# Patient Record
Sex: Male | Born: 1990 | Hispanic: Yes | Marital: Single | Smoking: Current some day smoker
Health system: Southern US, Community
[De-identification: ages and names within clinical notes are randomized; demographics above are authoritative.]

---

## 2015-12-07 ENCOUNTER — Emergency Department (HOSPITAL_COMMUNITY)
Admission: EM | Admit: 2015-12-07 | Discharge: 2015-12-08 | Disposition: A | Discharge status: Home / Self Care | Payer: Self-pay | Attending: Emergency Medicine | Admitting: Emergency Medicine

## 2015-12-07 DIAGNOSIS — Y939 Activity, unspecified: Secondary | ICD-10-CM | POA: Insufficient documentation

## 2015-12-07 DIAGNOSIS — Y999 Unspecified external cause status: Secondary | ICD-10-CM | POA: Insufficient documentation

## 2015-12-07 DIAGNOSIS — S0003XA Contusion of scalp, initial encounter: Secondary | ICD-10-CM | POA: Insufficient documentation

## 2015-12-07 DIAGNOSIS — S20221A Contusion of right back wall of thorax, initial encounter: Secondary | ICD-10-CM

## 2015-12-07 DIAGNOSIS — R079 Chest pain, unspecified: Secondary | ICD-10-CM | POA: Insufficient documentation

## 2015-12-07 DIAGNOSIS — Y929 Unspecified place or not applicable: Secondary | ICD-10-CM | POA: Insufficient documentation

## 2015-12-07 DIAGNOSIS — S20229A Contusion of unspecified back wall of thorax, initial encounter: Secondary | ICD-10-CM | POA: Insufficient documentation

## 2015-12-07 MED ORDER — OXYCODONE-ACETAMINOPHEN 5-325 MG PO TABS
2.0000 | ORAL_TABLET | Freq: Once | ORAL | Status: AC
  Administered 2015-12-07: 2 via ORAL
  Filled 2015-12-07: qty 2

## 2015-12-07 MED ORDER — KETOROLAC TROMETHAMINE 30 MG/ML IJ SOLN
30.0000 mg | Freq: Once | INTRAMUSCULAR | Status: AC
  Administered 2015-12-07: 30 mg via INTRAVENOUS
  Filled 2015-12-07: qty 1
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
## 2015-12-08 ENCOUNTER — Encounter (HOSPITAL_COMMUNITY): Payer: Self-pay

## 2015-12-08 ENCOUNTER — Emergency Department (HOSPITAL_COMMUNITY): Payer: Self-pay

## 2015-12-08 MED ORDER — METHOCARBAMOL 500 MG PO TABS: 500.0000 mg | ORAL_TABLET | Freq: Two times a day (BID) | ORAL | 0 refills | Status: DC | PRN

## 2015-12-08 MED ORDER — IBUPROFEN 600 MG PO TABS: 600.0000 mg | ORAL_TABLET | Freq: Four times a day (QID) | ORAL | 0 refills | Status: DC | PRN

## 2015-12-08 MED ORDER — HYDROCODONE-ACETAMINOPHEN 5-325 MG PO TABS: 1.0000 | ORAL_TABLET | Freq: Four times a day (QID) | ORAL | 0 refills | Status: DC | PRN

## 2015-12-08 MED ORDER — IOPAMIDOL (ISOVUE-300) INJECTION 61%
75.0000 mL | Freq: Once | INTRAVENOUS | Status: AC | PRN
  Administered 2015-12-08: 75 mL via INTRAVENOUS

## 2015-12-08 NOTE — ED Notes (Signed)
Patient transported to CT 

## 2015-12-08 NOTE — Discharge Instructions (Signed)
°  Tome ibuprofeno para el dolor leve a moderado y robaxin, segn sea necesario, para los espasmos musculares. Usted puede tomar Norco para dolor severo, segn sea necesario. reas de hielo de dolor / lesin. Vuelva para cualquier nuevo o concerniente sntomas.  Take ibuprofen for mild to moderate pain and robaxin, as needed, for muscle spasms. You may take Norco for severe pain, as needed. Ice areas of pain/injury. Return for any new or concerning symptoms.
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
## 2018-11-09 ENCOUNTER — Other Ambulatory Visit: Payer: Self-pay

## 2018-11-09 ENCOUNTER — Emergency Department (HOSPITAL_COMMUNITY)
Admission: EM | Admit: 2018-11-09 | Discharge: 2018-11-10 | Payer: Self-pay | Attending: Emergency Medicine | Admitting: Emergency Medicine

## 2018-11-09 DIAGNOSIS — R4182 Altered mental status, unspecified: Secondary | ICD-10-CM | POA: Insufficient documentation

## 2018-11-09 DIAGNOSIS — F10920 Alcohol use, unspecified with intoxication, uncomplicated: Secondary | ICD-10-CM | POA: Insufficient documentation

## 2018-11-09 DIAGNOSIS — Y906 Blood alcohol level of 120-199 mg/100 ml: Secondary | ICD-10-CM | POA: Insufficient documentation

## 2018-11-09 DIAGNOSIS — F419 Anxiety disorder, unspecified: Secondary | ICD-10-CM | POA: Insufficient documentation

## 2018-11-09 DIAGNOSIS — F600 Paranoid personality disorder: Secondary | ICD-10-CM | POA: Insufficient documentation
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
## 2018-11-10 LAB — ETHANOL: Alcohol, Ethyl (B): 144 mg/dL — ABNORMAL HIGH (ref <10)

## 2018-11-10 LAB — COMPREHENSIVE METABOLIC PANEL
ALT: 15 U/L (ref 0–44)
AST: 21 U/L (ref 15–41)
Albumin: 3.8 g/dL (ref 3.5–5.0)
Alkaline Phosphatase: 85 U/L (ref 38–126)
Anion gap: 10 (ref 5–15)
BUN: 22 mg/dL — ABNORMAL HIGH (ref 6–20)
CO2: 20 mmol/L — ABNORMAL LOW (ref 22–32)
Calcium: 8.2 mg/dL — ABNORMAL LOW (ref 8.9–10.3)
Chloride: 109 mmol/L (ref 98–111)
Creatinine, Ser: 1.37 mg/dL — ABNORMAL HIGH (ref 0.61–1.24)
GFR calc Af Amer: >60 mL/min (ref >60)
GFR calc non Af Amer: >60 mL/min (ref >60)
Glucose, Bld: 97 mg/dL (ref 70–99)
Potassium: 3.7 mmol/L (ref 3.5–5.1)
Sodium: 139 mmol/L (ref 135–145)
Total Bilirubin: 0.4 mg/dL (ref 0.3–1.2)
Total Protein: 6.5 g/dL (ref 6.5–8.1)

## 2018-11-10 LAB — CBC WITH DIFFERENTIAL/PLATELET
Abs Immature Granulocytes: 0.02 10*3/uL (ref 0.00–0.07)
Basophils Absolute: 0 10*3/uL (ref 0.0–0.1)
Basophils Relative: 1 %
Eosinophils Absolute: 0.1 10*3/uL (ref 0.0–0.5)
Eosinophils Relative: 3 %
HCT: 39 % (ref 39.0–52.0)
Hemoglobin: 13.2 g/dL (ref 13.0–17.0)
Immature Granulocytes: 1 %
Lymphocytes Relative: 25 %
Lymphs Abs: 1 10*3/uL (ref 0.7–4.0)
MCH: 30.8 pg (ref 26.0–34.0)
MCHC: 33.8 g/dL (ref 30.0–36.0)
MCV: 90.9 fL (ref 80.0–100.0)
Monocytes Absolute: 0.3 10*3/uL (ref 0.1–1.0)
Monocytes Relative: 7 %
Neutro Abs: 2.7 10*3/uL (ref 1.7–7.7)
Neutrophils Relative %: 63 %
Platelets: 220 10*3/uL (ref 150–400)
RBC: 4.29 MIL/uL (ref 4.22–5.81)
RDW: 13.2 % (ref 11.5–15.5)
WBC: 4.2 10*3/uL (ref 4.0–10.5)
nRBC: 0 % (ref 0.0–0.2)

## 2018-11-10 LAB — SALICYLATE LEVEL: Salicylate Lvl: <7 mg/dL (ref 2.8–30.0)

## 2018-11-10 LAB — RAPID URINE DRUG SCREEN, HOSP PERFORMED
Amphetamines: NOT DETECTED
Barbiturates: NOT DETECTED
Benzodiazepines: NOT DETECTED
Cocaine: NOT DETECTED
Opiates: NOT DETECTED
Tetrahydrocannabinol: NOT DETECTED

## 2018-11-10 LAB — ACETAMINOPHEN LEVEL: Acetaminophen (Tylenol), Serum: <10 ug/mL — ABNORMAL LOW (ref 10–30)

## 2018-11-10 MED ORDER — DIPHENHYDRAMINE HCL 50 MG/ML IJ SOLN
INTRAMUSCULAR | Status: AC
  Administered 2018-11-10: 50 mg
  Filled 2018-11-10: qty 1

## 2018-11-10 MED ORDER — LORAZEPAM 2 MG/ML IJ SOLN
INTRAMUSCULAR | Status: AC
  Administered 2018-11-10: 2 mg
  Filled 2018-11-10: qty 1

## 2018-11-10 NOTE — ED Notes (Signed)
[employer] called to inform [facility] of pt discharge as requested.

## 2018-11-10 NOTE — ED Provider Notes (Signed)
[facility]-EMERGENCY DEPT Provider Note  CSN: [id number] Arrival date & time: 11/09/18 2317  Chief Complaint(s) Alcohol Intoxication and drug intoxication  HPI Kevin Brown is a 28 y.o. male here for AMS in the setting of likely EtOH or durg use. Here with [employer] after being arrested for attacking his roommate. While being processed, patient was noted to be licking the floor and toilet. He states that "death" is coming to get him.   Remainder of history, ROS, and physical exam limited due to patient's condition (AMS). Additional information was obtained from [employer].   Level V Caveat.    HPI  Past Medical History No past medical history on file. There are no active problems to display for this patient.  Home Medication(s) Prior to Admission medications   Medication Sig Start Date End Date Taking? Authorizing Provider  HYDROcodone-acetaminophen (NORCO/VICODIN) 5-325 MG tablet Take 1-2 tablets by mouth every 6 (six) hours as needed. 12/08/15   Antony MaduraHumes, Kelly, PA-C  ibuprofen (ADVIL,MOTRIN) 600 MG tablet Take 1 tablet (600 mg total) by mouth every 6 (six) hours as needed. 12/08/15   Antony MaduraHumes, Kelly, PA-C  methocarbamol (ROBAXIN) 500 MG tablet Take 1 tablet (500 mg total) by mouth 2 (two) times daily as needed for muscle spasms. 12/08/15   Antony MaduraHumes, Kelly, PA-C                                                                                                                                    Past Surgical History ** The histories are not reviewed yet. Please review them in the "History" navigator section and refresh this SmartLink. Family History No family history on file.  Social History Social History   Tobacco Use  . Smoking status: Not on file  Substance Use Topics  . Alcohol use: Not on file  . Drug use: Not on file   Allergies Patient has no known allergies.  Review of Systems Review of Systems  Unable to perform ROS: Mental status change    Physical Exam  Vital Signs  I have reviewed the triage vital signs BP 130/81 (BP Location: Right Arm)   Pulse 88   Temp (!) 97 F (36.1 C) (Axillary)   Resp (!) 28   SpO2 95%   Physical Exam Vitals signs reviewed.  Constitutional:      General: He is not in acute distress.    Appearance: He is well-developed. He is not diaphoretic.     Comments: Intoxicated. Pointing to the ceiling, stating "death" is coming for him.  HENT:     Head: Normocephalic and atraumatic.     Nose: Nose normal.  Eyes:     General: No scleral icterus.       Right eye: No discharge.        Left eye: No discharge.     Conjunctiva/sclera: Conjunctivae normal.     Pupils: Pupils are equal, round, and reactive to  light.  Neck:     Musculoskeletal: Normal range of motion and neck supple.  Cardiovascular:     Rate and Rhythm: Normal rate and regular rhythm.     Heart sounds: No murmur. No friction rub. No gallop.   Pulmonary:     Effort: Pulmonary effort is normal. No respiratory distress.     Breath sounds: Normal breath sounds. No stridor. No rales.  Abdominal:     General: There is no distension.     Palpations: Abdomen is soft.     Tenderness: There is no abdominal tenderness.  Musculoskeletal:        General: No tenderness.  Skin:    General: Skin is warm and dry.     Findings: No erythema or rash.  Neurological:     Mental Status: He is alert and oriented to person, place, and time.  Psychiatric:        Mood and Affect: Mood is anxious.        Behavior: Behavior is agitated.        Thought Content: Thought content is paranoid.     ED Results and Treatments Labs (all labs ordered are listed, but only abnormal results are displayed) Labs Reviewed  COMPREHENSIVE METABOLIC PANEL - Abnormal; Notable for the following components:      Result Value   CO2 20 (*)    BUN 22 (*)    Creatinine, Ser 1.37 (*)    Calcium 8.2 (*)    All other components within normal limits  ETHANOL - Abnormal; Notable for the  following components:   Alcohol, Ethyl (B) 144 (*)    All other components within normal limits  ACETAMINOPHEN LEVEL - Abnormal; Notable for the following components:   Acetaminophen (Tylenol), Serum <10 (*)    All other components within normal limits  RAPID URINE DRUG SCREEN, HOSP PERFORMED  CBC WITH DIFFERENTIAL/PLATELET  SALICYLATE LEVEL                                                                                                                         EKG  EKG Interpretation  Date/Time:  Thursday November 09 2018 23:55:58 EDT Ventricular Rate:  118 PR Interval:    QRS Duration: 137 QT Interval:  375 QTC Calculation: 457 R Axis:   61 Text Interpretation:  Sinus tachycardia Paired ventricular premature complexes Consider right atrial enlargement IVCD, consider atypical RBBB ST elevation suggests acute pericarditis Artifact in lead(s) I II III aVR aVL aVF V1 V2 V3 V4 V5 V6 Poor data quality No old tracing to compare Confirmed by Addison Lank [phone]) on 11/10/2018 1:45:35 AM      Radiology No results found.  Pertinent labs & imaging results that were available during my care of the patient were reviewed by me and considered in my medical decision making (see chart for details).  Medications Ordered in ED Medications  LORazepam (ATIVAN) 2 MG/ML injection (2 mg  Given 11/10/18 0007)  diphenhydrAMINE (BENADRYL) 50 MG/ML  injection (50 mg  Given 11/10/18 0007)                                                                                                                                    Procedures .Critical Care Performed by: Nira Connardama, Pedro Eduardo, MD Authorized by: Nira Connardama, Pedro Eduardo, MD     CRITICAL CARE Performed by: Amadeo GarnetPedro Eduardo Cardama Total critical care time: 40 minutes Critical care time was exclusive of separately billable procedures and treating other patients. Critical care was necessary to treat or prevent imminent or life-threatening deterioration.  Critical care was time spent personally by me on the following activities: development of treatment plan with patient and/or surrogate as well as nursing, discussions with consultants, evaluation of patient's response to treatment, examination of patient, obtaining history from patient or surrogate, ordering and performing treatments and interventions, ordering and review of laboratory studies, ordering and review of radiographic studies, pulse oximetry and re-evaluation of patient's condition.   (including critical care time)  Medical Decision Making / ED Course I have reviewed the nursing notes for this encounter and the patient's prior records (if available in EHR or on provided paperwork).   Kevin Brown was evaluated in Emergency Department on 11/10/2018 for the symptoms described in the history of present illness. He was evaluated in the context of the global COVID-19 pandemic, which necessitated consideration that the patient might be at risk for infection with the SARS-CoV-2 virus that causes COVID-19. Institutional protocols and algorithms that pertain to the evaluation of patients at risk for COVID-19 are in a state of rapid change based on information released by regulatory bodies including the CDC and federal and state organizations. These policies and algorithms were followed during the patient's care in the ED.  Alcohol intoxication and agitation with bizarre behavior. Required chemical sedation for agitiation. Labs reassuring. Allowed to MTF.    6:04 AM Easily arousable. No longer agitated or altered. Ambulated w/o difficulty.   The patient appears reasonably screened and/or stabilized for discharge and I doubt any other medical condition or other [facility] requiring further screening, evaluation, or treatment in the ED at this time prior to discharge.  The patient is safe for discharge with strict return precautions.       Final Clinical Impression(s) / ED Diagnoses Final  diagnoses:  Alcoholic intoxication without complication (HCC)     The patient appears reasonably screened and/or stabilized for discharge and I doubt any other medical condition or other [facility] requiring further screening, evaluation, or treatment in the ED at this time prior to discharge.  Disposition: Discharge to [employer]  Condition: Good  I have discussed the results, Dx and Tx plan with the patient who expressed understanding and agree(s) with the plan. Discharge instructions discussed at great length. The patient was given strict return precautions who verbalized understanding of the instructions. No further questions at time of discharge.    ED Discharge Orders  None       Follow Up: Primary care provider  Schedule an appointment as soon as possible for a visit       This chart was dictated using voice recognition software.  Despite best efforts to proofread,  errors can occur which can change the documentation meaning.   Nira Connardama, Pedro Eduardo, MD 11/10/18 [phone]

## 2019-05-03 ENCOUNTER — Inpatient Hospital Stay (HOSPITAL_COMMUNITY)
Admission: RE | Admit: 2019-05-03 | Discharge: 2019-05-07 | DRG: 897 | Disposition: A | Discharge status: Home / Self Care | Payer: Federal, State, Local not specified - Other | Attending: Psychiatry | Admitting: Psychiatry

## 2019-05-03 ENCOUNTER — Other Ambulatory Visit: Payer: Self-pay

## 2019-05-03 ENCOUNTER — Encounter (HOSPITAL_COMMUNITY): Payer: Self-pay | Admitting: Psychiatry

## 2019-05-03 DIAGNOSIS — G4700 Insomnia, unspecified: Secondary | ICD-10-CM | POA: Diagnosis present

## 2019-05-03 DIAGNOSIS — F29 Unspecified psychosis not due to a substance or known physiological condition: Secondary | ICD-10-CM | POA: Diagnosis present

## 2019-05-03 DIAGNOSIS — Z20822 Contact with and (suspected) exposure to covid-19: Secondary | ICD-10-CM | POA: Diagnosis present

## 2019-05-03 DIAGNOSIS — F12159 Cannabis abuse with psychotic disorder, unspecified: Principal | ICD-10-CM | POA: Diagnosis present

## 2019-05-03 DIAGNOSIS — F17210 Nicotine dependence, cigarettes, uncomplicated: Secondary | ICD-10-CM | POA: Diagnosis present

## 2019-05-03 DIAGNOSIS — F19959 Other psychoactive substance use, unspecified with psychoactive substance-induced psychotic disorder, unspecified: Secondary | ICD-10-CM

## 2019-05-03 DIAGNOSIS — F329 Major depressive disorder, single episode, unspecified: Secondary | ICD-10-CM | POA: Diagnosis present

## 2019-05-03 LAB — RESPIRATORY PANEL BY RT PCR (FLU A&B, COVID)
Influenza A by PCR: NEGATIVE
Influenza B by PCR: NEGATIVE
SARS Coronavirus 2 by RT PCR: NEGATIVE

## 2019-05-03 MED ORDER — LORAZEPAM 1 MG PO TABS
2.0000 mg | ORAL_TABLET | Freq: Once | ORAL | Status: AC
  Administered 2019-05-03: 19:00:00 2 mg via ORAL

## 2019-05-03 MED ORDER — LORAZEPAM 1 MG PO TABS
2.0000 mg | ORAL_TABLET | Freq: Three times a day (TID) | ORAL | Status: DC
  Administered 2019-05-03: 2 mg via ORAL
  Filled 2019-05-03: qty 4

## 2019-05-03 MED ORDER — LORAZEPAM 1 MG PO TABS
2.0000 mg | ORAL_TABLET | Freq: Three times a day (TID) | ORAL | Status: DC
  Filled 2019-05-03: qty 4

## 2019-05-03 MED ORDER — DIPHENHYDRAMINE HCL 50 MG/ML IJ SOLN
50.0000 mg | Freq: Once | INTRAMUSCULAR | Status: AC
  Administered 2019-05-03: 50 mg via INTRAMUSCULAR
  Filled 2019-05-03: qty 1

## 2019-05-03 MED ORDER — HALOPERIDOL LACTATE 5 MG/ML IJ SOLN
5.0000 mg | Freq: Once | INTRAMUSCULAR | Status: AC
  Administered 2019-05-03: 21:00:00 5 mg via INTRAMUSCULAR
  Filled 2019-05-03: qty 1

## 2019-05-03 NOTE — BH Assessment (Signed)
Assessment Note  Kevin Brown is an 29 y.o. male. Patient presents to [facility] as a walk-in. He was escorted by [employer] and the [employer]. [employer] received a call to patient's place of residence [facility]), where he resides with another gentlemen. He was reportedly in the lobby area displaying bizarre behaviors. Those behaviors consisted of wrapping bibles in paper and stacking them in the lobby area. He reportedly told individuals that they were going to hell and he had plans to take everyone with him. He also reported that he needs to save the people in hell. Patient also verbalized suicidal ideations with an unknown plan. Patient was brought to [facility] as a walk-in to be assessed.   Upon arrival to [facility], patient was brought back to a room for his assessment. He is Spanish speaking and a interpreter was utilized for effective communication. Patient was observantly anxious, fidgety, and appeared to be experiencing acute psychosis. He was unable to provide details on his reason from coming [facility] States that he brought himself but he didn't know why. When asked if he was experiencing SI. He stated, "No, I'm going to hell". He did not confirm or deny event when asked a second time. He denies prior history of suicide attempts/gestures. Denies self mutilating behaviors. Denied HI. Denies history of violence and/or aggressive behaviors. Denies legal issues. He was asked about AVH's. He did not confirm/deny. However, repeated several times, "It doesn't matter because everyone is going to hell". Patient denies a history of mental health treatment and substance use. He denies prior history of mental inpatient treatment. Also, denies history of outpatient treatment.   Collateral information obtained from his supervisor(s) "Erinque" [phone] and "Sharyn Lull" [phone].    Diagnosis:   Past Medical History: No past medical history on file.  Family History: No family history on file.  Social  History:  has no history on file for tobacco, alcohol, and drug.  Additional Social History:  Alcohol / Drug Use Pain Medications: SEE MAR Prescriptions: SEE MAR Over the Counter: SEE MAR History of alcohol / drug use?: (Patient denies substance use. However, his boss reports that he has a 4-5 substance use history.) Longest period of sobriety (when/how long): unk Substance #1 Name of Substance 1: Alcohol, per note on  triage papers. Patient denies. 1 - Age of First Use: unk 1 - Amount (size/oz): unk 1 - Frequency: unk 1 - Duration: unk Substance #2 Name of Substance 2: Cocaine, per collateral information from patient's boss "Rosamaria Lints". Patient denies. 2 - Age of First Use: unk 2 - Amount (size/oz): unk 2 - Frequency: unk 2 - Duration: unk 2 - Last Use / Amount: unk Substance #3 Name of Substance 3: Cocaine, per collateral information from patient's boss "Rosamaria Lints". Patient denies. 3 - Age of First Use: unk 3 - Amount (size/oz): unk 3 - Frequency: unk 3 - Duration: unk 3 - Last Use / Amount: unk  CIWA: CIWA-Ar BP: (!) 121/96(Nurse was notified Jan RN) Pulse Rate: (!) 102(Nurse was notified Jan RN) COWS:    Allergies: No Known Allergies  Home Medications: (Not in a hospital admission)   OB/GYN Status:  No LMP for male patient.  General Assessment Data Location of Assessment: [facility] Assessment Services TTS Assessment: In system Is this a Tele or Face-to-Face Assessment?: Face-to-Face Is this an Initial Assessment or a Re-assessment for this encounter?: Initial Assessment Patient Accompanied by:: ([employer] and Behavioral Health Response Team) Language Other than English: Yes What is your preferred language: Spanish  Living Arrangements: Other (Comment)(lives in a hotel with another gentleman) What gender do you identify as?: Male Marital status: Single Pregnancy Status: No Living Arrangements: Other (Comment)(lives with a gentleman in a hotel room) Can pt return to current  living arrangement?: Yes Admission Status: Voluntary Is patient capable of signing voluntary admission?: Yes Referral Source: Self/Family/Friend Insurance type: (Self Pay)  Medical Screening Exam [facility] Walk-in ONLY) Medical Exam completed: Yes  Crisis Care Plan Living Arrangements: Other (Comment)(lives with a gentleman in a hotel room) Legal Guardian: (no legal guardian ) Name of Psychiatrist: (no psychiatrist ) Name of Therapist: (no therapist )  Education Status Is patient currently in school?: No Is the patient employed, 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 : No Suicidal Intent: No-Not Currently/Within Last 6 Months(patient denies; reports from Response Team indicate suicidal) Has patient had any suicidal intent within the past 6 months prior to admission? : No Is patient at risk for suicide?: No Suicidal Plan?: No Has patient had any suicidal plan within the past 6 months prior to admission? : No Access to Means: No What has been your use of drugs/alcohol within the last 12 months?: (patient denies; per collateral he uses cocaine and thc) Previous Attempts/Gestures: No How many times?: (0) Other Self Harm Risks: (no self harm reported) Triggers for Past Attempts: Other (Comment)(no previous attempts/gestures ) Intentional Self Injurious Behavior: None Family Suicide History: No Recent stressful life event(s): Other (Comment)(patient denies ) Persecutory voices/beliefs?: No Depression: Yes Depression Symptoms: Feeling angry/irritable, Feeling worthless/self pity, Loss of interest in usual pleasures, Fatigue, Isolating Substance abuse history and/or treatment for substance abuse?: No Suicide prevention information given to non-admitted  patients: Not applicable  Risk to Others within the past 6 months Homicidal Ideation: No Does patient have any lifetime risk of violence toward others beyond the six months prior to admission? : No Thoughts of Harm to Others: No Current Homicidal Intent: No Current Homicidal Plan: No Access to Homicidal Means: No Identified Victim: (n/a) History of harm to others?: No Assessment of Violence: None Noted Violent Behavior Description: (Patient is anxious and fidgety during the assessment) Does patient have access to weapons?: No Criminal Charges Pending?: No Does patient have a court date: No Is patient on probation?: No  Psychosis Hallucinations: None noted(pt denies but appears to be experiencing acute psychosis) Delusions: (patient denies but appears to be experiencing acute psychosi)  Mental Status Report Appearance/Hygiene: Disheveled Eye Contact: Good Motor Activity: Freedom of movement Speech: Logical/coherent Level of Consciousness: Alert Mood: Depressed Affect: Appropriate to circumstance Anxiety Level: None Thought Processes: Coherent, Relevant Judgement: Impaired Orientation: Person, Place, Time, Situation Obsessive Compulsive Thoughts/Behaviors: None  Cognitive Functioning Concentration: Decreased Memory: Recent Intact, Remote Intact Is patient IDD: No Insight: Poor Impulse Control: Fair Appetite: Fair Have you had any weight changes? : No Change Amount of the weight change? (lbs): (none reported) Sleep: No Change Total Hours of Sleep: (no hrs reported) Vegetative Symptoms: None  ADLScreening [facility] Assessment Services) Patient's cognitive ability adequate to safely complete daily activities?: Yes Patient able to express need for assistance with ADLs?: Yes Independently performs ADLs?: Yes (appropriate for developmental age)  Prior Inpatient Therapy Prior Inpatient Therapy: No  Prior Outpatient Therapy Prior Outpatient Therapy: No Does patient have an  ACCT team?: No Does patient have Intensive  In-House Services?  : No Does patient have [facility] services? : No Does patient have P4CC services?: No  ADL Screening (condition at time of admission) Patient's cognitive ability adequate to safely complete daily activities?: Yes Is the patient deaf or have difficulty hearing?: No Does the patient have difficulty seeing, even when wearing glasses/contacts?: No Does the patient have difficulty concentrating, remembering, or making decisions?: No Patient able to express need for assistance with ADLs?: Yes Does the patient have difficulty dressing or bathing?: No Independently performs ADLs?: Yes (appropriate for developmental age) Does the patient have difficulty walking or climbing stairs?: No Weakness of Legs: None Weakness of Arms/Hands: None  Home Assistive Devices/Equipment Home Assistive Devices/Equipment: None    Abuse/Neglect Assessment (Assessment to be complete while patient is alone) Physical Abuse: Denies Verbal Abuse: Denies Sexual Abuse: Denies Exploitation of patient/patient's resources: Denies Self-Neglect: Denies Values / Beliefs Cultural Requests During Hospitalization: None Spiritual Requests During Hospitalization: None Consults Spiritual Care Consult Needed: No Transition of Care Team Consult Needed: No Advance Directives (For Healthcare) Does Patient Have a Medical Advance Directive?: No Nutrition Screen- [facility] Adult/WL/AP Patient's home diet: Regular Has the patient recently lost weight without trying?: No Has the patient been eating poorly because of a decreased appetite?: No Malnutrition Screening Tool Score: 0        Disposition: Patient meets criteria for the Observation Unit. Admit(Admit to the observeration unit per [place], FNP) Disposition Initial Assessment Completed for this Encounter: Yes Disposition of Patient: Admit(Admit to the observeration unit per Dareen Piano, FNP)  On Site Evaluation by:    Reviewed with Physician:    Melynda Ripple 05/03/2019 2:31 PM

## 2019-05-03 NOTE — Progress Notes (Addendum)
Pt agitated, pacing the hallway. Unredirectable to room. NP Rashaun Dixon notified.  Pt walking in other patients rooms.

## 2019-05-03 NOTE — Progress Notes (Signed)
Patient ID: Kevin Brown, male   DOB: 07/03/1990, 29 y.o.   MRN: [id number] Pt awake, alert & responsive, no distress noted, redirectable.  Pt speaks Spanish.  Monitoring for safety.

## 2019-05-03 NOTE — Progress Notes (Signed)
Pt has been agitated pacing the hall, talking to himself, reaching in the air and wanting to leave. Attempted to complete admission with interpretor and pt was confused and agitated. AC alerted and orders obtained for medication. Safety maintained on the unit.

## 2019-05-03 NOTE — Plan of Care (Signed)
Patient seen with a translator/interpreter Patient had approached the hotel staff at the [facility] in explaining that he was a prophet to take people to hell, and had a Bible wrapped in a towel, police brought him here, the patient denies all symptoms however and continually repeats questions discharge however he did express some degree of paranoia and it is vague.  He also stated he was drinking something and "took something"  I explained to the patient this seems to fit the pattern of a drug-induced psychosis and he does have a history of substance abuse according the chart therefore were going to put him in observation and monitor/treat conservatively

## 2019-05-04 ENCOUNTER — Encounter (HOSPITAL_COMMUNITY): Payer: Self-pay | Admitting: Psychiatry

## 2019-05-04 DIAGNOSIS — F329 Major depressive disorder, single episode, unspecified: Secondary | ICD-10-CM | POA: Diagnosis present

## 2019-05-04 DIAGNOSIS — F12159 Cannabis abuse with psychotic disorder, unspecified: Secondary | ICD-10-CM | POA: Diagnosis present

## 2019-05-04 DIAGNOSIS — G4700 Insomnia, unspecified: Secondary | ICD-10-CM | POA: Diagnosis present

## 2019-05-04 DIAGNOSIS — Z20822 Contact with and (suspected) exposure to covid-19: Secondary | ICD-10-CM | POA: Diagnosis present

## 2019-05-04 DIAGNOSIS — F17210 Nicotine dependence, cigarettes, uncomplicated: Secondary | ICD-10-CM | POA: Diagnosis present

## 2019-05-04 DIAGNOSIS — F19959 Other psychoactive substance use, unspecified with psychoactive substance-induced psychotic disorder, unspecified: Secondary | ICD-10-CM | POA: Diagnosis not present

## 2019-05-04 DIAGNOSIS — F29 Unspecified psychosis not due to a substance or known physiological condition: Secondary | ICD-10-CM | POA: Diagnosis present

## 2019-05-04 LAB — CBC WITH DIFFERENTIAL/PLATELET
Abs Immature Granulocytes: 0 10*3/uL (ref 0.00–0.07)
Basophils Absolute: 0 10*3/uL (ref 0.0–0.1)
Basophils Relative: 1 %
Eosinophils Absolute: 0.2 10*3/uL (ref 0.0–0.5)
Eosinophils Relative: 5 %
HCT: 45.2 % (ref 39.0–52.0)
Hemoglobin: 15.1 g/dL (ref 13.0–17.0)
Immature Granulocytes: 0 %
Lymphocytes Relative: 31 %
Lymphs Abs: 1.1 10*3/uL (ref 0.7–4.0)
MCH: 30.7 pg (ref 26.0–34.0)
MCHC: 33.4 g/dL (ref 30.0–36.0)
MCV: 91.9 fL (ref 80.0–100.0)
Monocytes Absolute: 0.4 10*3/uL (ref 0.1–1.0)
Monocytes Relative: 10 %
Neutro Abs: 2 10*3/uL (ref 1.7–7.7)
Neutrophils Relative %: 53 %
Platelets: 226 10*3/uL (ref 150–400)
RBC: 4.92 MIL/uL (ref 4.22–5.81)
RDW: 12.7 % (ref 11.5–15.5)
WBC: 3.6 10*3/uL — ABNORMAL LOW (ref 4.0–10.5)
nRBC: 0 % (ref 0.0–0.2)

## 2019-05-04 LAB — COMPREHENSIVE METABOLIC PANEL
ALT: 21 U/L (ref 0–44)
AST: 29 U/L (ref 15–41)
Albumin: 3.7 g/dL (ref 3.5–5.0)
Alkaline Phosphatase: 98 U/L (ref 38–126)
Anion gap: 9 (ref 5–15)
BUN: 18 mg/dL (ref 6–20)
CO2: 27 mmol/L (ref 22–32)
Calcium: 8.7 mg/dL — ABNORMAL LOW (ref 8.9–10.3)
Chloride: 105 mmol/L (ref 98–111)
Creatinine, Ser: 1.2 mg/dL (ref 0.61–1.24)
GFR calc Af Amer: >60 mL/min (ref >60)
GFR calc non Af Amer: >60 mL/min (ref >60)
Glucose, Bld: 135 mg/dL — ABNORMAL HIGH (ref 70–99)
Potassium: 4.4 mmol/L (ref 3.5–5.1)
Sodium: 141 mmol/L (ref 135–145)
Total Bilirubin: 0.4 mg/dL (ref 0.3–1.2)
Total Protein: 6.3 g/dL — ABNORMAL LOW (ref 6.5–8.1)

## 2019-05-04 LAB — TSH: TSH: 2.276 u[IU]/mL (ref 0.350–4.500)

## 2019-05-04 LAB — ETHANOL: Alcohol, Ethyl (B): <10 mg/dL (ref <10)

## 2019-05-04 MED ORDER — OLANZAPINE 10 MG PO TBDP: 10.0000 mg | ORAL_TABLET | Freq: Three times a day (TID) | ORAL | Status: DC | PRN

## 2019-05-04 MED ORDER — TEMAZEPAM 15 MG PO CAPS: 30.0000 mg | ORAL_CAPSULE | Freq: Every day | ORAL | Status: DC

## 2019-05-04 MED ORDER — BENZTROPINE MESYLATE 0.5 MG PO TABS
0.5000 mg | ORAL_TABLET | Freq: Two times a day (BID) | ORAL | Status: DC
  Administered 2019-05-04: 10:00:00 0.5 mg via ORAL
  Filled 2019-05-04 (×3): qty 1

## 2019-05-04 MED ORDER — ZIPRASIDONE MESYLATE 20 MG IM SOLR: 20.0000 mg | INTRAMUSCULAR | Status: DC | PRN

## 2019-05-04 MED ORDER — THIAMINE HCL 100 MG PO TABS
100.0000 mg | ORAL_TABLET | Freq: Every day | ORAL | Status: DC
  Administered 2019-05-04 – 2019-05-07 (×4): 100 mg via ORAL
  Filled 2019-05-04 (×7): qty 1

## 2019-05-04 MED ORDER — LORAZEPAM 1 MG PO TABS: 1.0000 mg | ORAL_TABLET | Freq: Four times a day (QID) | ORAL | Status: DC | PRN

## 2019-05-04 MED ORDER — MAGNESIUM HYDROXIDE 400 MG/5ML PO SUSP: 30.0000 mL | Freq: Every day | ORAL | Status: DC | PRN

## 2019-05-04 MED ORDER — LORAZEPAM 1 MG PO TABS: 1.0000 mg | ORAL_TABLET | Freq: Three times a day (TID) | ORAL | Status: DC

## 2019-05-04 MED ORDER — TEMAZEPAM 15 MG PO CAPS
15.0000 mg | ORAL_CAPSULE | Freq: Every day | ORAL | Status: DC
  Administered 2019-05-05: 21:00:00 15 mg via ORAL
  Filled 2019-05-04 (×3): qty 1

## 2019-05-04 MED ORDER — HYDROXYZINE HCL 25 MG PO TABS: 25.0000 mg | ORAL_TABLET | Freq: Three times a day (TID) | ORAL | Status: DC | PRN

## 2019-05-04 MED ORDER — THIAMINE HCL 100 MG/ML IJ SOLN: 100.0000 mg | Freq: Every day | INTRAMUSCULAR | Status: DC

## 2019-05-04 MED ORDER — ACETAMINOPHEN 325 MG PO TABS: 650.0000 mg | ORAL_TABLET | Freq: Four times a day (QID) | ORAL | Status: DC | PRN

## 2019-05-04 MED ORDER — RISPERIDONE 3 MG PO TABS
3.0000 mg | ORAL_TABLET | Freq: Two times a day (BID) | ORAL | Status: DC
  Administered 2019-05-04: 10:00:00 3 mg via ORAL
  Filled 2019-05-04: qty 1

## 2019-05-04 MED ORDER — FOLIC ACID 1 MG PO TABS
1.0000 mg | ORAL_TABLET | Freq: Every day | ORAL | Status: DC
  Administered 2019-05-04 – 2019-05-07 (×4): 1 mg via ORAL
  Filled 2019-05-04 (×7): qty 1

## 2019-05-04 MED ORDER — LORAZEPAM 1 MG PO TABS: 1.0000 mg | ORAL_TABLET | ORAL | Status: DC | PRN

## 2019-05-04 MED ORDER — OLANZAPINE 5 MG PO TBDP
5.0000 mg | ORAL_TABLET | ORAL | Status: AC
  Administered 2019-05-04: 5 mg via ORAL
  Filled 2019-05-04 (×2): qty 1

## 2019-05-04 MED ORDER — RISPERIDONE 2 MG PO TABS
2.0000 mg | ORAL_TABLET | Freq: Two times a day (BID) | ORAL | Status: DC
  Administered 2019-05-04 – 2019-05-07 (×6): 2 mg via ORAL
  Filled 2019-05-04: qty 1
  Filled 2019-05-04: qty 14
  Filled 2019-05-04 (×3): qty 1
  Filled 2019-05-04: qty 14
  Filled 2019-05-04 (×5): qty 1
  Filled 2019-05-04 (×2): qty 14

## 2019-05-04 MED ORDER — ALUM & MAG HYDROXIDE-SIMETH 200-200-20 MG/5ML PO SUSP: 30.0000 mL | ORAL | Status: DC | PRN

## 2019-05-04 NOTE — Progress Notes (Signed)
Patient is alert and ambulatory. Patient denies SI/HI. Patient did take his medications without difficulty. Patient has been out in hall and did speak with this writer to ask if he could go home. Patient provided reassurance. Q 15 minute checks in progress and patient remains safe on unit. Monitoring continues.

## 2019-05-04 NOTE — Progress Notes (Signed)
Pt is extremely paranoid. Pt slept between the beds on the floor while on 200 hall. When taken to 400, pt immediately walked to the window, peeped through the slats, and then closed the curtains. The writer left a message at [phone] and left a message requesting a Spanish speaking interpretor. Informed them to call [phone] or [phone]

## 2019-05-04 NOTE — BHH Suicide Risk Assessment (Signed)
[facility] Admission Suicide Risk Assessment   Nursing information obtained from:  Patient(with interpretor) Demographic factors:  Male Current Mental Status:  Suicidal ideation indicated by others(per admit reporrt) Loss Factors:  NA Historical Factors:  NA(pt psychotic unable to assess) Risk Reduction Factors:  Living with another person, especially a relative  Total Time spent with patient: 45 minutes Principal Problem:  Diagnosis:  Active Problems:   Substance-induced psychotic disorder (HCC)  Subjective Data: This is the first psychiatric admission according to our system for this 29 year old [place] individual, he seen with a Engineer, structural available.  The bottom line is he had abused cannabis he became acutely paranoid, police were found and he was brought in for further evaluation.  He remains paranoid and will be transferred to the [address]  Continued Clinical Symptoms:    The "Alcohol Use Disorders Identification Test", Guidelines for Use in Primary Care, Second Edition.  World Science writer [facility]). Score between 0-7:  no or low risk or alcohol related problems. Score between 8-15:  moderate risk of alcohol related problems. Score between 16-19:  high risk of alcohol related problems. Score 20 or above:  warrants further diagnostic evaluation for alcohol dependence and treatment.   CLINICAL FACTORS:   Currently Psychotic Musculoskeletal: Strength & Muscle Tone: within normal limits Gait & Station: normal Patient leans: N/A  Psychiatric Specialty Exam: Physical Exam  Review of Systems  Blood pressure 126/88, pulse 73, temperature 98.4 F (36.9 C), temperature source Oral, resp. rate 16, height 5\' 1"  (1.549 m), weight 128 kg, SpO2 100 %.Body mass index is 53.32 kg/m.  General Appearance: Casual  Eye Contact:  Minimal  Speech:  Slow  Volume:  Decreased  Mood:  Anxious  Affect:  Blunt  Thought Process:  Descriptions of Associations: Circumstantial   Orientation:  Full (Time, Place, and Person)  Thought Content:  Delusions, Paranoid Ideation and Rumination  Suicidal Thoughts:  No  Homicidal Thoughts:  No  Memory:  Immediate;   Poor Recent;   Poor Remote;   Poor  Judgement:  Impaired  Insight:  Lacking  Psychomotor Activity:  Normal  Concentration:  Concentration: Poor and Attention Span: Poor  Recall:  of Knowledge:  Fair  Language:  Content vague  Akathisia:  Negative  Handed:  Right  AIMS (if indicated):     Assets:  Leisure Time Physical Health Resilience  ADL's:  Intact  Cognition:  WNL  Sleep:       COGNITIVE FEATURES THAT CONTRIBUTE TO RISK:  Closed-mindedness and Loss of executive function    SUICIDE RISK:   Mild:  Suicidal ideation of limited frequency, intensity, duration, and specificity.  There are no identifiable plans, no associated intent, mild dysphoria and related symptoms, good self-control (both objective and subjective assessment), few other risk factors, and identifiable protective factors, including available and accessible social support.  PLAN OF CARE: Patient will be transferred to the 300 hall  I certify that inpatient services furnished can reasonably be expected to improve the patient's condition.   [employer], MD 05/04/2019, 11:37 AM

## 2019-05-04 NOTE — BHH Counselor (Addendum)
TTS received Findings and Custody from [employer] and contacted [employer] to serve IVC. Paperwork left at front desk for [employer] to serve.  [employer] arrived and signed papers. TTS offered to walk officer back to unit to serve paperwork. Officer responded "I just sign them" and walked away. Counselor walked papers to adult unit where they were placed in patient's chart.
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
## 2019-05-04 NOTE — Discharge Summary (Signed)
Patient admitted to [facility] 306-01

## 2019-05-04 NOTE — Progress Notes (Signed)
   05/04/19 2200  Psych Admission Type (Psych Patients Only)  Admission Status Voluntary  Psychosocial Assessment  Patient Complaints None  Eye Contact Intense  Facial Expression Anxious  Affect Appropriate to circumstance  Speech Pressured  Interaction Assertive  Motor Activity Slow  Appearance/Hygiene Disheveled  Behavior Characteristics Appropriate to situation  Mood Suspicious  Thought Process  Coherency Disorganized  Content Preoccupation  Delusions UTA  Perception Hallucinations  Hallucination Auditory;Visual  Judgment Impaired  Confusion Mild  Danger to Self  Current suicidal ideation? Denies  Danger to Others  Danger to Others None reported or observed
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
## 2019-05-04 NOTE — Progress Notes (Signed)
Patient did not attend AA group meeting. 

## 2019-05-04 NOTE — BH Assessment (Signed)
BHH Assessment Progress Note  Per Malvin Johns, MD, this pt requires psychiatric hospitalization at this time.  Jasmine has assigned pt to [facility] Rm 306-1.  Pt presents voluntarily, but refuses to be admitted.  Dr Jola Babinski finds that pt meets criteria for IVC, which he has initiated.  IVC documents have been faxed to [facility], and [employer] confirms receipt.  He agrees to fax Findings and Custody Order to [phone].  Celedonio Miyamoto, LCSW agrees to watch for it and to contact [employer] to complete Return of Service upon receipt.  Doylene Canning, [place] Behavioral Health Coordinator [phone]

## 2019-05-04 NOTE — Plan of Care (Signed)
Case summary  This 29 year old [place] individual was brought in by police, he was staying at a local hotel, and expressed delusional believes that he was a prophet to "take people to hell" and was behaving bizarrely.  He later acknowledged cannabis abuse and possibly abuse of other compounds but would not be more specific.  Through the night he is continued to display paranoid behaviors and thoughts, believes someone was outside his window this morning.  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

## 2019-05-04 NOTE — Tx Team (Signed)
Initial Treatment Plan 05/04/2019 2:36 PM [name] PRK:[id number]    PATIENT STRESSORS: Health problems Substance abuse   PATIENT STRENGTHS: Capable of independent living Motivation for treatment/growth Physical Health Supportive family/friends   PATIENT IDENTIFIED PROBLEMS: anxiety  paranoia  Substance use/abuse                 DISCHARGE CRITERIA:  Ability to meet basic life and health needs Adequate post-discharge living arrangements Improved stabilization in mood, thinking, and/or behavior Motivation to continue treatment in a less acute level of care  PRELIMINARY DISCHARGE PLAN: Attend 12-step recovery group Outpatient therapy Return to previous living arrangement  PATIENT/FAMILY INVOLVEMENT: This treatment plan has been presented to and reviewed with the patient, Kevin Brown.  The patient and family have been given the opportunity to ask questions and make suggestions.  Raylene Miyamoto, RN 05/04/2019, 2:36 PM

## 2019-05-05 NOTE — BHH Group Notes (Signed)
BHH Group Notes: (Clinical Social Work)   05/05/2019      Type of Therapy:  Group Therapy   Participation Level:  Did Not Attend - was invited both individually by MHT and by overhead announcement, chose not to attend.   Poppy Mcafee Grossman-Orr, LCSW 05/05/2019, 11:20 AM    

## 2019-05-05 NOTE — BHH Group Notes (Signed)
Adult Psychoeducational Group Note  Date:  05/05/2019 Time:  3:52 PM  Group Topic/Focus:  Goals Group:   The focus of this group is to help patients establish daily goals to achieve during treatment and discuss how the patient can incorporate goal setting into their daily lives to aide in recovery.  Participation Level:  Did Not Attend   Dione Housekeeper 05/05/2019, 3:52 PM

## 2019-05-05 NOTE — Progress Notes (Signed)
Psychoeducational Group Note  Date:  05/05/2019 Time:  2030 Group Topic/Focus:  wrap up group  Participation Level: Did Not Attend  Participation Quality:  Not Applicable  Affect:  Not Applicable  Cognitive:  Not Applicable  Insight:  Not Applicable  Engagement in Group: Not Applicable  Additional Comments:  Pt was notified that group was beginning but remained in bed.   Marcille Buffy 05/05/2019, 9:33 PM

## 2019-05-05 NOTE — Progress Notes (Signed)
[facility] MD Progress Note  05/05/2019 3:37 PM Kevin Brown  MRN:  [id number]  Subjective: Kevin Brown reports, "I don't know why I was here. Can I go?"  Objective: Kevin Brown is a 29 year old [place] male who speaks no English but is employed by a Technical brewer, he had presented to the front desk of a [employer] with a Bible, wrapped in a towel, elaborating he was a prophet to take people "to hell". Kevin Brown is seen, chart reviewed. The chart findings discussed with the treatment. Patient is in bed resting. He presents alert, smiling a lot. Limited English language, able to make needs known to the best of his ability. He denies any complaints today. He asked, "I don't know why I was here, can I go". He has been in his room all day. Minimal interaction with the other patients. Patient does not appear to be responding to any internal stimuli. He presents with improved affect & eye contact. He currently denies any SIHI, AVH, delusional thoughts or paranoia. He does not appear to be in any apparent distress.  Principal Problem: Substance-induced psychotic disorder (HCC)  Diagnosis: Principal Problem:   Substance-induced psychotic disorder (HCC) Active Problems:   Psychosis (HCC)  Total Time spent with patient: 25 minutes  Past Psychiatric History: See H&P  Past Medical History: History reviewed. No pertinent past medical history. History reviewed. No pertinent surgical history.  Family History: History reviewed. No pertinent family history.  Family Psychiatric  History: See H&P  Social History:  Social History   Substance and Sexual Activity  Alcohol Use Not Currently     Social History   Substance and Sexual Activity  Drug Use Yes  . Types: Methamphetamines, Marijuana    Social History   Socioeconomic History  . Marital status: Single    Spouse name: Not on file  . Number of children: Not on file  . Years of education: Not on file  . Highest education level: Not on file   Occupational History  . Not on file  Tobacco Use  . Smoking status: Current Every Day Smoker    Types: Cigarettes  . Smokeless tobacco: Never Used  Substance and Sexual Activity  . Alcohol use: Not Currently  . Drug use: Yes    Types: Methamphetamines, Marijuana  . Sexual activity: Not on file  Other Topics Concern  . Not on file  Social History Narrative  . Not on file   Social Determinants of Health   Financial Resource Strain:   . Difficulty of Paying Living Expenses: Not on file  Food Insecurity:   . Worried About Programme researcher, broadcasting/film/video in the Last Year: Not on file  . Ran Out of Food in the Last Year: Not on file  Transportation Needs:   . Lack of Transportation (Medical): Not on file  . Lack of Transportation (Non-Medical): Not on file  Physical Activity:   . Days of Exercise per Week: Not on file  . Minutes of Exercise per Session: Not on file  Stress:   . Feeling of Stress : Not on file  Social Connections:   . Frequency of Communication with Friends and Family: Not on file  . Frequency of Social Gatherings with Friends and Family: Not on file  . Attends Religious Services: Not on file  . Active Member of Clubs or Organizations: Not on file  . Attends Banker Meetings: Not on file  . Marital Status: Not on file   Additional Social History:  Pain Medications: SEE MAR Prescriptions: SEE MAR Over the Counter: SEE MAR History of alcohol / drug use?: (Patient denies substance use. However, his boss reports that he has a 4-5 substance use history.) Longest period of sobriety (when/how long): unk Name of Substance 1: Alcohol, per note on  triage papers. Patient denies. 1 - Age of First Use: unk 1 - Amount (size/oz): unk 1 - Frequency: unk 1 - Duration: unk Name of Substance 2: Cocaine, per collateral information from patient's boss "Thyra Breed". Patient denies. 2 - Age of First Use: unk 2 - Amount (size/oz): unk 2 - Frequency: unk 2 - Duration:  unk 2 - Last Use / Amount: unk Name of Substance 3: Cocaine, per collateral information from patient's boss "Thyra Breed". Patient denies. 3 - Age of First Use: unk 3 - Amount (size/oz): unk 3 - Frequency: unk 3 - Duration: unk 3 - Last Use / Amount: unk  Sleep: Good  Appetite:  Good  Current Medications: Current Facility-Administered Medications  Medication Dose Route Frequency Provider Last Rate Last Admin  . acetaminophen (TYLENOL) tablet 650 mg  650 mg Oral Q6H PRN Antonieta Pert, MD      . alum & mag hydroxide-simeth (MAALOX/MYLANTA) 200-200-20 MG/5ML suspension 30 mL  30 mL Oral Q4H PRN Antonieta Pert, MD      . folic acid (FOLVITE) tablet 1 mg  1 mg Oral Daily Antonieta Pert, MD   1 mg at 05/05/19 5681  . hydrOXYzine (ATARAX/VISTARIL) tablet 25 mg  25 mg Oral TID PRN Antonieta Pert, MD      . OLANZapine zydis (ZYPREXA) disintegrating tablet 10 mg  10 mg Oral Q8H PRN Antonieta Pert, MD       And  . LORazepam (ATIVAN) tablet 1 mg  1 mg Oral PRN Antonieta Pert, MD       And  . ziprasidone (GEODON) injection 20 mg  20 mg Intramuscular PRN Antonieta Pert, MD      . LORazepam (ATIVAN) tablet 1 mg  1 mg Oral Q6H PRN Antonieta Pert, MD      . magnesium hydroxide (MILK OF MAGNESIA) suspension 30 mL  30 mL Oral Daily PRN Antonieta Pert, MD      . risperiDONE (RISPERDAL) tablet 2 mg  2 mg Oral BID Malvin Johns, MD   2 mg at 05/05/19 0803  . temazepam (RESTORIL) capsule 15 mg  15 mg Oral QHS Malvin Johns, MD      . thiamine (B-1) injection 100 mg  100 mg Intravenous Daily Antonieta Pert, MD      . thiamine tablet 100 mg  100 mg Oral Daily Antonieta Pert, MD   100 mg at 05/05/19 2751   Lab Results:  Results for orders placed or performed during the hospital encounter of 05/03/19 (from the past 48 hour(s))  CBC with Differential/Platelet     Status: Abnormal   Collection Time: 05/04/19  5:45 PM  Result Value Ref Range   WBC 3.6 (L) 4.0 - 10.5 K/uL    RBC 4.92 4.22 - 5.81 MIL/uL   Hemoglobin 15.1 13.0 - 17.0 g/dL   HCT 70.0 17.4 - 94.4 %   MCV 91.9 80.0 - 100.0 fL   MCH 30.7 26.0 - 34.0 pg   MCHC 33.4 30.0 - 36.0 g/dL   RDW 96.7 59.1 - 63.8 %   Platelets 226 150 - 400 K/uL   nRBC 0.0 0.0 - 0.2 %   Neutrophils Relative %  53 %   Neutro Abs 2.0 1.7 - 7.7 K/uL   Lymphocytes Relative 31 %   Lymphs Abs 1.1 0.7 - 4.0 K/uL   Monocytes Relative 10 %   Monocytes Absolute 0.4 0.1 - 1.0 K/uL   Eosinophils Relative 5 %   Eosinophils Absolute 0.2 0.0 - 0.5 K/uL   Basophils Relative 1 %   Basophils Absolute 0.0 0.0 - 0.1 K/uL   Immature Granulocytes 0 %   Abs Immature Granulocytes 0.00 0.00 - 0.07 K/uL    Comment: Performed at [facility], [address] [place], [place] [address]  Comprehensive metabolic panel     Status: Abnormal   Collection Time: 05/04/19  5:45 PM  Result Value Ref Range   Sodium 141 135 - 145 mmol/L   Potassium 4.4 3.5 - 5.1 mmol/L   Chloride 105 98 - 111 mmol/L   CO2 27 22 - 32 mmol/L   Glucose, Bld 135 (H) 70 - 99 mg/dL   BUN 18 6 - 20 mg/dL   Creatinine, Ser 3.14 0.61 - 1.24 mg/dL   Calcium 8.7 (L) 8.9 - 10.3 mg/dL   Total Protein 6.3 (L) 6.5 - 8.1 g/dL   Albumin 3.7 3.5 - 5.0 g/dL   AST 29 15 - 41 U/L   ALT 21 0 - 44 U/L   Alkaline Phosphatase 98 38 - 126 U/L   Total Bilirubin 0.4 0.3 - 1.2 mg/dL   GFR calc non Af Amer >60 >60 mL/min   GFR calc Af Amer >60 >60 mL/min   Anion gap 9 5 - 15    Comment: Performed at [facility], [address] [place], [place] [address]  TSH     Status: None   Collection Time: 05/04/19  5:45 PM  Result Value Ref Range   TSH 2.276 0.350 - 4.500 uIU/mL    Comment: Performed by a 3rd Generation assay with a functional sensitivity of <=0.01 uIU/mL. Performed at [facility], [address] [place], [place] [address]   Ethanol     Status: None   Collection Time: 05/04/19  5:45 PM  Result Value Ref Range    Alcohol, Ethyl (B) <10 <10 mg/dL    Comment: (NOTE) Lowest detectable limit for serum alcohol is 10 mg/dL. For medical purposes only. Performed at [facility], [address], [place], [place] [address]    Blood Alcohol level:  Lab Results  Component Value Date   ETH <10 05/04/2019   ETH 144 (H) 11/10/2018   Metabolic Disorder Labs: No results found for: HGBA1C, MPG No results found for: PROLACTIN No results found for: CHOL, TRIG, HDL, CHOLHDL, VLDL, LDLCALC  Physical Findings: AIMS: Facial and Oral Movements Muscles of Facial Expression: None, normal Lips and Perioral Area: None, normal Jaw: None, normal Tongue: None, normal,Extremity Movements Upper (arms, wrists, hands, fingers): None, normal Lower (legs, knees, ankles, toes): None, normal, Trunk Movements Neck, shoulders, hips: None, normal, Overall Severity Severity of abnormal movements (highest score from questions above): None, normal Incapacitation due to abnormal movements: None, normal Patient's awareness of abnormal movements (rate only patient's report): No Awareness, Dental Status Current problems with teeth and/or dentures?: No Does patient usually wear dentures?: No  CIWA:  CIWA-Ar Total: 3 COWS:  COWS Total Score: 6  Musculoskeletal: Strength & Muscle Tone: within normal limits Gait & Station: normal Patient leans: N/A  Psychiatric Specialty Exam: Physical Exam  Nursing note and vitals reviewed. Constitutional: He is oriented  to person, place, and time. He appears well-developed.  Cardiovascular: Normal rate.  Respiratory: Effort normal.  Genitourinary:    Genitourinary Comments: Deferred   Musculoskeletal:        General: Normal range of motion.     Cervical back: Normal range of motion.  Neurological: He is alert and oriented to person, place, and time.  Skin: Skin is warm and dry.    Review of Systems  Constitutional: Negative for chills, diaphoresis and fever.  HENT:  Negative for congestion, rhinorrhea, sneezing and sore throat.   Respiratory: Negative for cough, shortness of breath and wheezing.   Cardiovascular: Negative for chest pain and palpitations.  Gastrointestinal: Negative for diarrhea, nausea and vomiting.  Genitourinary: Negative for difficulty urinating.  Musculoskeletal: Negative.   Allergic/Immunologic: Negative for environmental allergies and food allergies.  Neurological: Negative for dizziness, tremors, seizures and light-headedness.  Psychiatric/Behavioral: Positive for confusion (Does not know the reason for his admission), dysphoric mood (Stabilizing) and hallucinations (Stabilizing). Negative for agitation, behavioral problems, decreased concentration, self-injury, sleep disturbance and suicidal ideas. The patient is nervous/anxious ("I don't why Iwas here"). The patient is not hyperactive.     Blood pressure 110/84, pulse 78, temperature 98.4 F (36.9 C), temperature source Oral, resp. rate 16, height 5\' 1"  (1.549 m), weight 128 kg, SpO2 100 %.Body mass index is 53.32 kg/m.  General Appearance: Casual  Eye Contact: Good  Speech: Slow  Volume:  Decreased  Mood: Euthymic, bright affect.  Affect: Reactive, bright today  Thought Process:  Descriptions of Associations: Circumstantial  Orientation:  Full (Time, Place, and Person)  Thought Content: Brought in to [facility] due to Delusions, Paranoid Ideation & Rumination, today 05-05-19, patient does not appear to be responding to any internal stimuli.  Suicidal Thoughts:  No  Homicidal Thoughts:  No  Memory:  Immediate;   Poor Recent;   Poor Remote;   Poor  Judgement:  Impaired  Insight:  Lacking  Psychomotor Activity:  Normal  Concentration:  Concentration: Fair and Attention Span: Good  Recall:  [employer] of Knowledge:  Fair  Language:  Content vague  Akathisia:  Negative  Handed:  Right  AIMS (if indicated):     Assets:  Leisure Time Physical Health Resilience  ADL's:  Intact   Cognition:  WNL    Sleep:  Number of Hours: 6.75   Treatment Plan Summary: Daily contact with patient to assess and evaluate symptoms and progress in treatment and Medication management.  -Continue inpatient hospitalization.  -Will continue today 05/05/2019 plan as below except where it is noted.  Alcohol detox.              -Continue Lorazepam 1 mg po Q 6 hrs prn for CIWA > 10  -Mood control  -Continue risperdal 2 mg po bid.  -Anxiety  -Continue atarax 25 mg po q6h prn anxiety  -Insomnia  -Continue Restoril 15 mg po qhs  -Agitation             -Continue Olanzapine Zydis 10 mg po Q 8 hrs prn.               &              -Lorazepam 1 mg po prn x 1 dose.                &    -Continue geodon 20mg  IM q12h prn x 1 dose.  -Encourage participation in groups and therapeutic milieu  -Disposition planning will be  ongoing  Armandina Stammer, NP, pMHNP, FNP-BC 05/05/2019, 3:37 PM

## 2019-05-05 NOTE — Progress Notes (Signed)
Pt presents with an anxious affect. Pt denies SI/HI. Pt denies AVH. Pt denies depression and anxiety. Pt wanted to know when he would be able to get out of the hospital and expressed not having an understanding as to why he's here in the hospital. Writer explained to the pt that he's here in the hospital for observation and safety due to the bizarre behaviors he was exhibiting at the hotel. The pt verbalized understanding.    Orders reviewed. V/s assessed. Verbal support provided. 15 minute checks performed for safety. Pt encouraged to shower and offered clean scrubs.   Pt compliant with taking meds.  No side effects to the medications observed.

## 2019-05-06 NOTE — Progress Notes (Signed)
   05/06/19 1300  Psych Admission Type (Psych Patients Only)  Admission Status Voluntary  Psychosocial Assessment  Patient Complaints None  Eye Contact Brief  Facial Expression Animated  Affect Anxious  Speech Language other than English  Interaction Assertive  Motor Activity Fidgety  Appearance/Hygiene Unremarkable  Behavior Characteristics Appropriate to situation  Mood Anxious  Thought Process  Coherency WDL  Content WDL  Delusions None reported or observed  Perception WDL  Hallucination None reported or observed  Judgment Limited  Confusion None  Danger to Self  Current suicidal ideation? Denies  Danger to Others  Danger to Others None reported or observed

## 2019-05-06 NOTE — [facility] Group Notes (Signed)
[facility] LCSW Group Therapy Note  05/06/2019  10:00-11:00AM  Type of Therapy and Topic:  Group Therapy:  A Hero Worthy of Support  Participation Level:  Did Not Attend   Description of Group:  Patients in this group were introduced to the concept that additional supports including self-support are an essential part of recovery.  Matching needs with supports to help fulfill those needs was explained.  Establishing boundaries that can gradually be increased or decreased was described, with patients giving their own examples of establishing appropriate boundaries in their lives.  A song entitled "My Own Hero" was played and a group discussion ensued in which patients stated it inspired them to help themselves in order to succeed, because other people cannot achieve their goals such as sobriety or stability for them.  A song was played called "I Am Enough" which led to a discussion about being willing to believe we are worth the effort of being a self-support.   Therapeutic Goals: 1)  demonstrate the importance of being a key part of one's own support system 2)  discuss various available supports 3)  encourage patient to use music as part of their self-support and focus on goals 4)  elicit ideas from patients about supports that need to be added   Summary of Patient Progress:  The patient did not attend.  Therapeutic Modalities:   Motivational Interviewing Activity  Lynnell Chad
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
## 2019-05-06 NOTE — Progress Notes (Signed)
[facility] MD Progress Note  05/06/2019 2:10 PM Kevin Brown  MRN:  [id number]  Subjective: Kevin Brown reports, "I'm very good. I'm nervous"  Objective: Kevin Brown is a 29 year old [place] male who speaks no English but is employed by a Technical brewer, he had presented to the front desk of a [employer] with a Bible, wrapped in a towel, elaborating he was a prophet to take people "to hell". Kevin Brown is seen, chart reviewed. The chart findings discussed with the treatment team. Patient is in bed resting. He presents alert, smiling a lot. Limited English language, able to make needs known to the best of his ability. He denies any complaints today. He says today, "I'm very good. I'm nervous. He did get out of bed when asked do so to attend group sessions this morning. This he did without problem. Minimal interaction with the other patients during the group sessions due to language barrier. He presents stable, well groomed, smiling a lot. There are no agitation or irritability noted. He seem anxious to get out the hospital, that could be the reason for him being "anxious today". Patient does not appear to be responding to any internal stimuli. He presents with improved facial expression & good eye contact. He currently denies any SIHI, AVH, delusional thoughts or paranoia. He does not appear to be in any apparent distress.  Principal Problem: Substance-induced psychotic disorder (HCC)  Diagnosis: Principal Problem:   Substance-induced psychotic disorder (HCC) Active Problems:   Psychosis (HCC)  Total Time spent with patient: 25 minutes  Past Psychiatric History: See H&P  Past Medical History: History reviewed. No pertinent past medical history. History reviewed. No pertinent surgical history.  Family History: History reviewed. No pertinent family history.  Family Psychiatric  History: See H&P  Social History:  Social History   Substance and Sexual Activity  Alcohol Use Not Currently      Social History   Substance and Sexual Activity  Drug Use Yes  . Types: Methamphetamines, Marijuana    Social History   Socioeconomic History  . Marital status: Single    Spouse name: Not on file  . Number of children: Not on file  . Years of education: Not on file  . Highest education level: Not on file  Occupational History  . Not on file  Tobacco Use  . Smoking status: Current Every Day Smoker    Types: Cigarettes  . Smokeless tobacco: Never Used  Substance and Sexual Activity  . Alcohol use: Not Currently  . Drug use: Yes    Types: Methamphetamines, Marijuana  . Sexual activity: Not on file  Other Topics Concern  . Not on file  Social History Narrative  . Not on file   Social Determinants of Health   Financial Resource Strain:   . Difficulty of Paying Living Expenses: Not on file  Food Insecurity:   . Worried About Programme researcher, broadcasting/film/video in the Last Year: Not on file  . Ran Out of Food in the Last Year: Not on file  Transportation Needs:   . Lack of Transportation (Medical): Not on file  . Lack of Transportation (Non-Medical): Not on file  Physical Activity:   . Days of Exercise per Week: Not on file  . Minutes of Exercise per Session: Not on file  Stress:   . Feeling of Stress : Not on file  Social Connections:   . Frequency of Communication with Friends and Family: Not on file  . Frequency of Social Gatherings with Friends  and Family: Not on file  . Attends Religious Services: Not on file  . Active Member of Clubs or Organizations: Not on file  . Attends Banker Meetings: Not on file  . Marital Status: Not on file   Additional Social History:    Pain Medications: SEE MAR Prescriptions: SEE MAR Over the Counter: SEE MAR History of alcohol / drug use?: (Patient denies substance use. However, his boss reports that he has a 4-5 substance use history.) Longest period of sobriety (when/how long): unk Name of Substance 1: Alcohol, per note on   triage papers. Patient denies. 1 - Age of First Use: unk 1 - Amount (size/oz): unk 1 - Frequency: unk 1 - Duration: unk Name of Substance 2: Cocaine, per collateral information from patient's boss "Thyra Breed". Patient denies. 2 - Age of First Use: unk 2 - Amount (size/oz): unk 2 - Frequency: unk 2 - Duration: unk 2 - Last Use / Amount: unk Name of Substance 3: Cocaine, per collateral information from patient's boss "Thyra Breed". Patient denies. 3 - Age of First Use: unk 3 - Amount (size/oz): unk 3 - Frequency: unk 3 - Duration: unk 3 - Last Use / Amount: unk  Sleep: Good  Appetite:  Good  Current Medications: Current Facility-Administered Medications  Medication Dose Route Frequency Provider Last Rate Last Admin  . acetaminophen (TYLENOL) tablet 650 mg  650 mg Oral Q6H PRN Antonieta Pert, MD      . alum & mag hydroxide-simeth (MAALOX/MYLANTA) 200-200-20 MG/5ML suspension 30 mL  30 mL Oral Q4H PRN Antonieta Pert, MD      . folic acid (FOLVITE) tablet 1 mg  1 mg Oral Daily Antonieta Pert, MD   1 mg at 05/06/19 7035  . hydrOXYzine (ATARAX/VISTARIL) tablet 25 mg  25 mg Oral TID PRN Antonieta Pert, MD      . OLANZapine zydis (ZYPREXA) disintegrating tablet 10 mg  10 mg Oral Q8H PRN Antonieta Pert, MD       And  . LORazepam (ATIVAN) tablet 1 mg  1 mg Oral PRN Antonieta Pert, MD       And  . ziprasidone (GEODON) injection 20 mg  20 mg Intramuscular PRN Antonieta Pert, MD      . LORazepam (ATIVAN) tablet 1 mg  1 mg Oral Q6H PRN Antonieta Pert, MD      . magnesium hydroxide (MILK OF MAGNESIA) suspension 30 mL  30 mL Oral Daily PRN Antonieta Pert, MD      . risperiDONE (RISPERDAL) tablet 2 mg  2 mg Oral BID Malvin Johns, MD   2 mg at 05/06/19 0835  . temazepam (RESTORIL) capsule 15 mg  15 mg Oral QHS Malvin Johns, MD   15 mg at 05/05/19 2117  . thiamine (B-1) injection 100 mg  100 mg Intravenous Daily Antonieta Pert, MD      . thiamine tablet 100 mg   100 mg Oral Daily Antonieta Pert, MD   100 mg at 05/06/19 0093   Lab Results:  Results for orders placed or performed during the hospital encounter of 05/03/19 (from the past 48 hour(s))  CBC with Differential/Platelet     Status: Abnormal   Collection Time: 05/04/19  5:45 PM  Result Value Ref Range   WBC 3.6 (L) 4.0 - 10.5 K/uL   RBC 4.92 4.22 - 5.81 MIL/uL   Hemoglobin 15.1 13.0 - 17.0 g/dL   HCT 81.8 29.9 - 37.1 %  MCV 91.9 80.0 - 100.0 fL   MCH 30.7 26.0 - 34.0 pg   MCHC 33.4 30.0 - 36.0 g/dL   RDW 27.0 35.0 - 09.3 %   Platelets 226 150 - 400 K/uL   nRBC 0.0 0.0 - 0.2 %   Neutrophils Relative % 53 %   Neutro Abs 2.0 1.7 - 7.7 K/uL   Lymphocytes Relative 31 %   Lymphs Abs 1.1 0.7 - 4.0 K/uL   Monocytes Relative 10 %   Monocytes Absolute 0.4 0.1 - 1.0 K/uL   Eosinophils Relative 5 %   Eosinophils Absolute 0.2 0.0 - 0.5 K/uL   Basophils Relative 1 %   Basophils Absolute 0.0 0.0 - 0.1 K/uL   Immature Granulocytes 0 %   Abs Immature Granulocytes 0.00 0.00 - 0.07 K/uL    Comment: Performed at [facility], [address] [place], [place] [address]  Comprehensive metabolic panel     Status: Abnormal   Collection Time: 05/04/19  5:45 PM  Result Value Ref Range   Sodium 141 135 - 145 mmol/L   Potassium 4.4 3.5 - 5.1 mmol/L   Chloride 105 98 - 111 mmol/L   CO2 27 22 - 32 mmol/L   Glucose, Bld 135 (H) 70 - 99 mg/dL   BUN 18 6 - 20 mg/dL   Creatinine, Ser 9.37 0.61 - 1.24 mg/dL   Calcium 8.7 (L) 8.9 - 10.3 mg/dL   Total Protein 6.3 (L) 6.5 - 8.1 g/dL   Albumin 3.7 3.5 - 5.0 g/dL   AST 29 15 - 41 U/L   ALT 21 0 - 44 U/L   Alkaline Phosphatase 98 38 - 126 U/L   Total Bilirubin 0.4 0.3 - 1.2 mg/dL   GFR calc non Af Amer >60 >60 mL/min   GFR calc Af Amer >60 >60 mL/min   Anion gap 9 5 - 15    Comment: Performed at [facility], [address] [place], [place] [address]  TSH     Status: None   Collection Time: 05/04/19  5:45 PM   Result Value Ref Range   TSH 2.276 0.350 - 4.500 uIU/mL    Comment: Performed by a 3rd Generation assay with a functional sensitivity of <=0.01 uIU/mL. Performed at [facility], [address] [place], [place] [address]   Ethanol     Status: None   Collection Time: 05/04/19  5:45 PM  Result Value Ref Range   Alcohol, Ethyl (B) <10 <10 mg/dL    Comment: (NOTE) Lowest detectable limit for serum alcohol is 10 mg/dL. For medical purposes only. Performed at [facility], [address], [place], [place] [address]    Blood Alcohol level:  Lab Results  Component Value Date   ETH <10 05/04/2019   ETH 144 (H) 11/10/2018   Metabolic Disorder Labs: No results found for: HGBA1C, MPG No results found for: PROLACTIN No results found for: CHOL, TRIG, HDL, CHOLHDL, VLDL, LDLCALC  Physical Findings: AIMS: Facial and Oral Movements Muscles of Facial Expression: None, normal Lips and Perioral Area: None, normal Jaw: None, normal Tongue: None, normal,Extremity Movements Upper (arms, wrists, hands, fingers): None, normal Lower (legs, knees, ankles, toes): None, normal, Trunk Movements Neck, shoulders, hips: None, normal, Overall Severity Severity of abnormal movements (highest score from questions above): None, normal Incapacitation due to abnormal movements: None, normal Patient's awareness of abnormal movements (rate only patient's report): No Awareness, Dental Status Current problems with teeth and/or dentures?:  No Does patient usually wear dentures?: No  CIWA:  CIWA-Ar Total: 3 COWS:  COWS Total Score: 6  Musculoskeletal: Strength & Muscle Tone: within normal limits Gait & Station: normal Patient leans: N/A  Psychiatric Specialty Exam: Physical Exam  Nursing note and vitals reviewed. Constitutional: He is oriented to person, place, and time. He appears well-developed.  Cardiovascular: Normal rate.  Respiratory: Effort normal.   Genitourinary:    Genitourinary Comments: Deferred   Musculoskeletal:        General: Normal range of motion.     Cervical back: Normal range of motion.  Neurological: He is alert and oriented to person, place, and time.  Skin: Skin is warm and dry.    Review of Systems  Constitutional: Negative for chills, diaphoresis and fever.  HENT: Negative for congestion, rhinorrhea, sneezing and sore throat.   Respiratory: Negative for cough, shortness of breath and wheezing.   Cardiovascular: Negative for chest pain and palpitations.  Gastrointestinal: Negative for diarrhea, nausea and vomiting.  Genitourinary: Negative for difficulty urinating.  Musculoskeletal: Negative.   Allergic/Immunologic: Negative for environmental allergies and food allergies.  Neurological: Negative for dizziness, tremors, seizures and light-headedness.  Psychiatric/Behavioral: Positive for confusion (Does not know the reason for his admission), dysphoric mood (Stabilizing) and hallucinations (Stabilizing). Negative for agitation, behavioral problems, decreased concentration, self-injury, sleep disturbance and suicidal ideas. The patient is nervous/anxious ("I don't why Iwas here"). The patient is not hyperactive.     Blood pressure 110/84, pulse 78, temperature 98.4 F (36.9 C), temperature source Oral, resp. rate 16, height 5\' 1"  (1.549 m), weight 128 kg, SpO2 100 %.Body mass index is 53.32 kg/m.  General Appearance: Casual  Eye Contact: Good  Speech: Slow  Volume:  Decreased  Mood: Euthymic, bright affect.  Affect: Reactive, bright today  Thought Process:  Descriptions of Associations: Circumstantial  Orientation:  Full (Time, Place, and Person)  Thought Content: Brought in to [facility] due to Delusions, Paranoid Ideation & Rumination, today 05-05-19, patient does not appear to be responding to any internal stimuli.  Suicidal Thoughts:  No  Homicidal Thoughts:  No  Memory:  Immediate;   Poor Recent;   Poor Remote;    Poor  Judgement:  Impaired  Insight:  Lacking  Psychomotor Activity:  Normal  Concentration:  Concentration: Fair and Attention Span: Good  Recall:  05-07-19 of Knowledge:  Fair  Language:  Content vague  Akathisia:  Negative  Handed:  Right  AIMS (if indicated):     Assets:  Leisure Time Physical Health Resilience  ADL's:  Intact  Cognition:  WNL    Sleep:  Number of Hours: 6.75   Treatment Plan Summary: Daily contact with patient to assess and evaluate symptoms and progress in treatment and Medication management.  -Continue inpatient hospitalization.  -Will continue today 05/06/2019 plan as below except where it is noted.  Alcohol detox.              -Continue Lorazepam 1 mg po Q 6 hrs prn for CIWA > 10  -Mood control  -Continue risperdal 2 mg po bid.  -Anxiety  -Continue atarax 25 mg po q6h prn anxiety  -Insomnia  -Continue Restoril 15 mg po qhs  -Agitation             -Continue Olanzapine Zydis 10 mg po Q 8 hrs prn.               &              -  Lorazepam 1 mg po prn x 1 dose.                &    -Continue geodon 20mg  IM q12h prn x 1 dose.  -Encourage participation in groups and therapeutic milieu  -Disposition planning will be ongoing  Lindell Spar, NP, pMHNP, FNP-BC 05/06/2019, 2:10 PMPatient ID: Kevin Brown, male   DOB: June 02, 1990, 29 y.o.   MRN: [id number]

## 2019-05-06 NOTE — Progress Notes (Signed)
   05/05/19 2000  Psychosocial Assessment  Patient Complaints None  Eye Contact Brief  Facial Expression Animated  Affect Anxious  Speech Logical/coherent  Interaction Minimal;Poor (Speaks mostly Spanish)  Motor Activity Fidgety  Appearance/Hygiene Unremarkable  Behavior Characteristics Cooperative  Mood Pleasant;Depressed;Anxious  Thought Process  Coherency WDL  Content WDL  Delusions None reported or observed  Perception WDL  Hallucination None reported or observed  Judgment Limited  Confusion None  Danger to Self  Current suicidal ideation? Denies  Danger to Others  Danger to Others None reported or observed

## 2019-05-07 MED ORDER — RISPERIDONE 2 MG PO TABS: 2.0000 mg | ORAL_TABLET | Freq: Two times a day (BID) | ORAL | 0 refills | Status: DC

## 2019-05-07 MED ORDER — TEMAZEPAM 15 MG PO CAPS: 15.0000 mg | ORAL_CAPSULE | Freq: Every day | ORAL | 0 refills | Status: DC

## 2019-05-07 NOTE — Tx Team (Signed)
Interdisciplinary Treatment and Diagnostic Plan Update  05/07/2019 Time of Session: 10:00am Kevin Brown MRN: [id number]  Principal Diagnosis: Substance-induced psychotic disorder [facility])  Secondary Diagnoses: Principal Problem:   Substance-induced psychotic disorder (HCC) Active Problems:   Psychosis (HCC)   Current Medications:  Current Facility-Administered Medications  Medication Dose Route Frequency Provider Last Rate Last Admin  . acetaminophen (TYLENOL) tablet 650 mg  650 mg Oral Q6H PRN Antonieta Pert, MD      . alum & mag hydroxide-simeth (MAALOX/MYLANTA) 200-200-20 MG/5ML suspension 30 mL  30 mL Oral Q4H PRN Antonieta Pert, MD      . folic acid (FOLVITE) tablet 1 mg  1 mg Oral Daily Antonieta Pert, MD   1 mg at 05/07/19 0825  . hydrOXYzine (ATARAX/VISTARIL) tablet 25 mg  25 mg Oral TID PRN Antonieta Pert, MD      . OLANZapine zydis (ZYPREXA) disintegrating tablet 10 mg  10 mg Oral Q8H PRN Antonieta Pert, MD       And  . LORazepam (ATIVAN) tablet 1 mg  1 mg Oral PRN Antonieta Pert, MD       And  . ziprasidone (GEODON) injection 20 mg  20 mg Intramuscular PRN Antonieta Pert, MD      . LORazepam (ATIVAN) tablet 1 mg  1 mg Oral Q6H PRN Antonieta Pert, MD      . magnesium hydroxide (MILK OF MAGNESIA) suspension 30 mL  30 mL Oral Daily PRN Antonieta Pert, MD      . risperiDONE (RISPERDAL) tablet 2 mg  2 mg Oral BID Malvin Johns, MD   2 mg at 05/07/19 0825  . temazepam (RESTORIL) capsule 15 mg  15 mg Oral QHS Malvin Johns, MD   15 mg at 05/05/19 2117  . thiamine (B-1) injection 100 mg  100 mg Intravenous Daily Antonieta Pert, MD      . thiamine tablet 100 mg  100 mg Oral Daily Antonieta Pert, MD   100 mg at 05/07/19 0825   PTA Medications: Medications Prior to Admission  Medication Sig Dispense Refill Last Dose  . HYDROcodone-acetaminophen (NORCO/VICODIN) 5-325 MG tablet Take 1-2 tablets by mouth every 6 (six) hours as needed.  (Patient not taking: Reported on 05/03/2019) 15 tablet 0 Completed Course at Unknown time  . ibuprofen (ADVIL,MOTRIN) 600 MG tablet Take 1 tablet (600 mg total) by mouth every 6 (six) hours as needed. (Patient not taking: Reported on 05/03/2019) 30 tablet 0 Completed Course at Unknown time  . methocarbamol (ROBAXIN) 500 MG tablet Take 1 tablet (500 mg total) by mouth 2 (two) times daily as needed for muscle spasms. (Patient not taking: Reported on 05/03/2019) 20 tablet 0 Completed Course at Unknown time    Patient Stressors: Health problems Substance abuse  Patient Strengths: Capable of independent living Motivation for treatment/growth Physical Health Supportive family/friends  Treatment Modalities: Medication Management, Group therapy, Case management,  1 to 1 session with clinician, Psychoeducation, Recreational therapy.   Physician Treatment Plan for Primary Diagnosis: Substance-induced psychotic disorder [facility]) Long Term Goal(s): Improvement in symptoms so as ready for discharge Improvement in symptoms so as ready for discharge   Short Term Goals: Ability to demonstrate self-control will improve Ability to identify and develop effective coping behaviors will improve Ability to demonstrate self-control will improve Ability to identify and develop effective coping behaviors will improve Ability to maintain clinical measurements within normal limits will improve  Medication Management: Evaluate patient's response, side effects,  and tolerance of medication regimen.  Therapeutic Interventions: 1 to 1 sessions, Unit Group sessions and Medication administration.  Evaluation of Outcomes: Adequate for Discharge  Physician Treatment Plan for Secondary Diagnosis: Principal Problem:   Substance-induced psychotic disorder ([place]) Active Problems:   Psychosis ([place])  Long Term Goal(s): Improvement in symptoms so as ready for discharge Improvement in symptoms so as ready for discharge   Short  Term Goals: Ability to demonstrate self-control will improve Ability to identify and develop effective coping behaviors will improve Ability to demonstrate self-control will improve Ability to identify and develop effective coping behaviors will improve Ability to maintain clinical measurements within normal limits will improve     Medication Management: Evaluate patient's response, side effects, and tolerance of medication regimen.  Therapeutic Interventions: 1 to 1 sessions, Unit Group sessions and Medication administration.  Evaluation of Outcomes: Adequate for Discharge   RN Treatment Plan for Primary Diagnosis: Substance-induced psychotic disorder ([place]) Long Term Goal(s): Knowledge of disease and therapeutic regimen to maintain health will improve  Short Term Goals: Ability to verbalize feelings will improve, Ability to identify and develop effective coping behaviors will improve and Compliance with prescribed medications will improve  Medication Management: RN will administer medications as ordered by provider, will assess and evaluate patient's response and provide education to patient for prescribed medication. RN will report any adverse and/or side effects to prescribing provider.  Therapeutic Interventions: 1 on 1 counseling sessions, Psychoeducation, Medication administration, Evaluate responses to treatment, Monitor vital signs and CBGs as ordered, Perform/monitor CIWA, COWS, AIMS and Fall Risk screenings as ordered, Perform wound care treatments as ordered.  Evaluation of Outcomes: Adequate for Discharge   LCSW Treatment Plan for Primary Diagnosis: Substance-induced psychotic disorder [facility]) Long Term Goal(s): Safe transition to appropriate next level of care at discharge, Engage patient in therapeutic group addressing interpersonal concerns.  Short Term Goals: Engage patient in aftercare planning with referrals and resources  Therapeutic Interventions: Assess for all  discharge needs, 1 to 1 time with Social worker, Explore available resources and support systems, Assess for adequacy in community support network, Educate family and significant other(s) on suicide prevention, Complete Psychosocial Assessment, Interpersonal group therapy.  Evaluation of Outcomes: Adequate for Discharge   Progress in Treatment: Attending groups: No. Participating in groups: No. Taking medication as prescribed: Yes. Toleration medication: Yes. Family/Significant other contact made: No, will contact:  no one, patient declined consent Patient understands diagnosis: Yes. Limited insight Discussing patient identified problems/goals with staff: Yes. Medical problems stabilized or resolved: Yes. Denies suicidal/homicidal ideation: Yes. Issues/concerns per patient self-inventory: No. Other:   New problem(s) identified: None   New Short Term/Long Term Goal(s): medication stabilization, elimination of SI thoughts, development of comprehensive mental wellness plan.    Patient Goals:  "To leave"  Discharge Plan or Barriers: Patient plans to discharge home with a friend. He will follow up with [facility] for outpatient medication management and therapy services.   Reason for Continuation of Hospitalization: None   Estimated Length of Stay: Discharge, 05/07/2019  Attendees: Patient: Kevin Brown 05/07/2019 11:31 AM  Physician: Dr. Myles Lipps, MD 05/07/2019 11:31 AM  Nursing:  05/07/2019 11:31 AM  RN Care Manager: 05/07/2019 11:31 AM  Social Worker: Radonna Ricker, LCSW 05/07/2019 11:31 AM  Recreational Therapist:  05/07/2019 11:31 AM  Other:  05/07/2019 11:31 AM  Other:  05/07/2019 11:31 AM  Other: 05/07/2019 11:31 AM    Scribe for Treatment Team: Marylee Floras, [place] 05/07/2019 11:31 AM

## 2019-05-07 NOTE — Progress Notes (Signed)
Recreation Therapy Notes  Date:  2.15.21 Time: 0930 Location: [address] Dayroom  Group Topic: Stress Management  Goal Area(s) Addresses:  Patient will identify positive stress management techniques. Patient will identify benefits of using stress management post d/c.  Intervention: Stress Management  Activity :  Meditation. LRT played a meditation that focused on letting go of the past and focusing on the present.  Patients were to listen and follow along as the meditation played to engage in activity.  Education:  Stress Management, Discharge Planning.   Education Outcome: Acknowledges Education  Clinical Observations/Feedback: Pt did not attend activity.   Dencil Cayson, LRT/CTRS         Glori Machnik A 05/07/2019 11:39 AM 

## 2019-05-07 NOTE — BHH Suicide Risk Assessment (Signed)
[facility] Discharge Suicide Risk Assessment   Principal Problem: Substance-induced psychotic disorder [facility]) Discharge Diagnoses: Principal Problem:   Substance-induced psychotic disorder (HCC) Active Problems:   Psychosis (HCC)   Total Time spent with patient: 15 minutes  Musculoskeletal: Strength & Muscle Tone: within normal limits Gait & Station: normal Patient leans: N/A  Psychiatric Specialty Exam: Review of Systems  All other systems reviewed and are negative.   Blood pressure 110/84, pulse 78, temperature 98.4 F (36.9 C), temperature source Oral, resp. rate 16, height 5\' 1"  (1.549 m), weight 128 kg, SpO2 100 %.Body mass index is 53.32 kg/m.  General Appearance: Casual  Eye Contact::  Good  Speech:  Normal Rate409  Volume:  Normal  Mood:  Euthymic  Affect:  Congruent  Thought Process:  Coherent and Descriptions of Associations: Intact  Orientation:  Full (Time, Place, and Person)  Thought Content:  Logical  Suicidal Thoughts:  No  Homicidal Thoughts:  No  Memory:  Immediate;   Fair Recent;   Fair Remote;   Fair  Judgement:  Intact  Insight:  Fair  Psychomotor Activity:  Normal  Concentration:  Fair  Recall:  [phone] of Knowledge:Fair  Language: Good  Akathisia:  Negative  Handed:  Right  AIMS (if indicated):     Assets:  Desire for Improvement Resilience  Sleep:  Number of Hours: 6.75  Cognition: WNL  ADL's:  Intact   Mental Status Per Nursing Assessment::   On Admission:  Suicidal ideation indicated by others(per admit reporrt)  Demographic Factors:  Male, Low socioeconomic status and Living alone  Loss Factors: NA  Historical Factors: Impulsivity  Risk Reduction Factors:   NA  Continued Clinical Symptoms:  Alcohol/Substance Abuse/Dependencies More than one psychiatric diagnosis  Cognitive Features That Contribute To Risk:  None    Suicide Risk:  Minimal: No identifiable suicidal ideation.  Patients presenting with no risk factors but  with morbid ruminations; may be classified as minimal risk based on the severity of the depressive symptoms    Plan Of Care/Follow-up recommendations:  Activity:  ad lib  [phone], MD 05/07/2019, 8:07 AM

## 2019-05-07 NOTE — Progress Notes (Signed)
Patient ID: Kevin Brown, male   DOB: 1990/12/23, 29 y.o.   MRN: [id number] Patient discharged to home/self care via lyft.  Patient denies SI, HI and AVH upon discharge.  Patient acknowledged understanding of all discharge instructions and receipt of personal belongings.
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
## 2019-05-07 NOTE — BHH Counselor (Addendum)
CSW contacted the hotel in which patient was staying prior to arrival, per management at the [facility], patient is not welcome to return.  Using video interpreting services, CSW explained this to patient. Patient reports he has a friend he can stay with temporarily and declined a list of hotels at similar price points.  Patient also declined follow up. CSW will provide patient with resources for medication management, should he wish to follow up.  Enid Cutter, MSW, LCSW-A Clinical Social Worker [facility] Adult Unit  [phone]

## 2019-05-07 NOTE — Progress Notes (Signed)
  [facility] Adult Case Management Discharge Plan :  Will you be returning to the same living situation after discharge:  No. Going to stay with a friend. At discharge, do you have transportation home?: Yes,  Safe Transportation. Do you have the ability to pay for your medications: No. Declined referrals.   Release of information consent forms completed and in the chart.  Patient to Follow up at: Follow-up Information    [facility], [employer]. Go to.   Specialty: Professional Counselor Why: Maricela Curet un seguimiento con la clnica para obtener servicios para pacientes ambulatorios durante el horario de atencin; Lunes a viernes 8:30a.-12: 00p y 1:00p-2:30p. Asegrese de traer lo siguiente; identificacin con foto, tarjeta de seguro, y medications Contact information: [facility] [place] [address] [place] [address] [phone]           Next level of care provider has access to [facility] Link:no  Safety Planning and Suicide Prevention discussed: Yes,  with patient.  Has patient been referred to the Quitline?: N/A patient is not a smoker  Patient has been referred for addiction treatment: Pt. refused referral  Darreld Mclean, LCSWA 05/07/2019, 9:39 AM

## 2019-12-20 ENCOUNTER — Encounter (HOSPITAL_COMMUNITY): Payer: Self-pay

## 2019-12-20 ENCOUNTER — Emergency Department (HOSPITAL_COMMUNITY)
Admission: EM | Admit: 2019-12-20 | Discharge: 2019-12-22 | Disposition: A | Discharge status: Other Acute Inpatient Hospital | Payer: Self-pay | Attending: Emergency Medicine | Admitting: Emergency Medicine

## 2019-12-20 ENCOUNTER — Other Ambulatory Visit: Payer: Self-pay

## 2019-12-20 DIAGNOSIS — F17210 Nicotine dependence, cigarettes, uncomplicated: Secondary | ICD-10-CM | POA: Insufficient documentation

## 2019-12-20 DIAGNOSIS — R109 Unspecified abdominal pain: Secondary | ICD-10-CM | POA: Insufficient documentation

## 2019-12-20 DIAGNOSIS — F1910 Other psychoactive substance abuse, uncomplicated: Secondary | ICD-10-CM | POA: Insufficient documentation

## 2019-12-20 DIAGNOSIS — F29 Unspecified psychosis not due to a substance or known physiological condition: Secondary | ICD-10-CM | POA: Insufficient documentation

## 2019-12-20 DIAGNOSIS — F19959 Other psychoactive substance use, unspecified with psychoactive substance-induced psychotic disorder, unspecified: Secondary | ICD-10-CM | POA: Diagnosis present

## 2019-12-20 DIAGNOSIS — Z20822 Contact with and (suspected) exposure to covid-19: Secondary | ICD-10-CM | POA: Insufficient documentation

## 2019-12-20 DIAGNOSIS — F333 Major depressive disorder, recurrent, severe with psychotic symptoms: Secondary | ICD-10-CM | POA: Insufficient documentation

## 2019-12-20 DIAGNOSIS — R45851 Suicidal ideations: Secondary | ICD-10-CM | POA: Insufficient documentation

## 2019-12-20 LAB — CBC WITH DIFFERENTIAL/PLATELET
Abs Immature Granulocytes: 0.01 10*3/uL (ref 0.00–0.07)
Basophils Absolute: 0.1 10*3/uL (ref 0.0–0.1)
Basophils Relative: 1 %
Eosinophils Absolute: 0.2 10*3/uL (ref 0.0–0.5)
Eosinophils Relative: 4 %
HCT: 43.8 % (ref 39.0–52.0)
Hemoglobin: 15 g/dL (ref 13.0–17.0)
Immature Granulocytes: 0 %
Lymphocytes Relative: 23 %
Lymphs Abs: 1 10*3/uL (ref 0.7–4.0)
MCH: 30.7 pg (ref 26.0–34.0)
MCHC: 34.2 g/dL (ref 30.0–36.0)
MCV: 89.8 fL (ref 80.0–100.0)
Monocytes Absolute: 0.3 10*3/uL (ref 0.1–1.0)
Monocytes Relative: 8 %
Neutro Abs: 2.8 10*3/uL (ref 1.7–7.7)
Neutrophils Relative %: 64 %
Platelets: 242 10*3/uL (ref 150–400)
RBC: 4.88 MIL/uL (ref 4.22–5.81)
RDW: 12.6 % (ref 11.5–15.5)
WBC: 4.3 10*3/uL (ref 4.0–10.5)
nRBC: 0 % (ref 0.0–0.2)

## 2019-12-20 LAB — COMPREHENSIVE METABOLIC PANEL
ALT: 14 U/L (ref 0–44)
AST: 18 U/L (ref 15–41)
Albumin: 3.9 g/dL (ref 3.5–5.0)
Alkaline Phosphatase: 99 U/L (ref 38–126)
Anion gap: 10 (ref 5–15)
BUN: 8 mg/dL (ref 6–20)
CO2: 26 mmol/L (ref 22–32)
Calcium: 9.3 mg/dL (ref 8.9–10.3)
Chloride: 104 mmol/L (ref 98–111)
Creatinine, Ser: 0.74 mg/dL (ref 0.61–1.24)
GFR calc Af Amer: >60 mL/min (ref >60)
GFR calc non Af Amer: >60 mL/min (ref >60)
Glucose, Bld: 122 mg/dL — ABNORMAL HIGH (ref 70–99)
Potassium: 3.9 mmol/L (ref 3.5–5.1)
Sodium: 140 mmol/L (ref 135–145)
Total Bilirubin: 0.5 mg/dL (ref 0.3–1.2)
Total Protein: 6.8 g/dL (ref 6.5–8.1)

## 2019-12-20 LAB — RESPIRATORY PANEL BY RT PCR (FLU A&B, COVID)
Influenza A by PCR: NEGATIVE
Influenza B by PCR: NEGATIVE
SARS Coronavirus 2 by RT PCR: NEGATIVE

## 2019-12-20 LAB — ACETAMINOPHEN LEVEL: Acetaminophen (Tylenol), Serum: <10 ug/mL — ABNORMAL LOW (ref 10–30)

## 2019-12-20 LAB — LIPASE, BLOOD: Lipase: 61 U/L — ABNORMAL HIGH (ref 11–51)

## 2019-12-20 LAB — ETHANOL: Alcohol, Ethyl (B): <10 mg/dL (ref <10)

## 2019-12-20 LAB — SALICYLATE LEVEL: Salicylate Lvl: <7 mg/dL — ABNORMAL LOW (ref 7.0–30.0)

## 2019-12-20 MED ORDER — ONDANSETRON HCL 4 MG PO TABS: 4.0000 mg | ORAL_TABLET | Freq: Three times a day (TID) | ORAL | Status: DC | PRN

## 2019-12-20 MED ORDER — ALUM & MAG HYDROXIDE-SIMETH 200-200-20 MG/5ML PO SUSP: 30.0000 mL | Freq: Four times a day (QID) | ORAL | Status: DC | PRN

## 2019-12-20 MED ORDER — ACETAMINOPHEN 325 MG PO TABS: 650.0000 mg | ORAL_TABLET | ORAL | Status: DC | PRN
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
## 2019-12-20 NOTE — ED Notes (Signed)
Pt calm, resting.  

## 2019-12-20 NOTE — ED Notes (Signed)
Pt to room 27. Pt calm, cooperative, no s/s of distress.  Sitter at bedside.
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
## 2019-12-20 NOTE — ED Notes (Signed)
PT BELONGINGS LOCATED IN LOCKER 27 INCLUDES 1 PT BAG AND BOOKBAG

## 2019-12-20 NOTE — ED Notes (Addendum)
Pt changed into burgandy scrubs and belongings collected.  Belongings included a patient belonging bag with a shirt, sunglasses, shorts with a belt and a sweatshirt as well as a large grey [employer] backpack.  Belongings stored in the triage nurses station cabinet. Wanded by security.

## 2019-12-21 LAB — RAPID URINE DRUG SCREEN, HOSP PERFORMED
Amphetamines: POSITIVE — AB
Barbiturates: NOT DETECTED
Benzodiazepines: NOT DETECTED
Cocaine: NOT DETECTED
Opiates: NOT DETECTED
Tetrahydrocannabinol: NOT DETECTED

## 2019-12-21 MED ORDER — OLANZAPINE 5 MG PO TABS
5.0000 mg | ORAL_TABLET | Freq: Two times a day (BID) | ORAL | Status: DC
  Administered 2019-12-21 – 2019-12-22 (×2): 5 mg via ORAL
  Filled 2019-12-21 (×2): qty 1
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
## 2019-12-21 NOTE — Progress Notes (Signed)
Per Berneice Heinrich, NP patient meets inpatient criteria.  Referrals have been faxed to the following facilitites:    [facility]        [facility]        [facility]        [facility]        [facility]        [facility]        [facility]        [facility]        [facility]        [facility]        [facility]        [facility]        [facility]        [facility]       CSW will continue to follow up.  Ladoris Gene MSW,LCSWA,LCASA Clinical Social Worker  [place] Disposition, CSW [phone] (cell)

## 2019-12-21 NOTE — ED Notes (Signed)
Pt alert and cooperative this shift. Pt ate breakfast. Pt resting in bed.

## 2019-12-21 NOTE — BH Assessment (Addendum)
TO BE [facility]@ [id number] attempted to assess patient. Lequita Halt, RN will have to locate translator machine. TTS will be contacted when once patient is ready to be assessed.   Patient's nurse contacted this clinician x2 to complete TTS assessment since this morning. However, TTS clinician was occupied with walk-ins. TTS now ready to complete TTS assessment.

## 2019-12-21 NOTE — Consult Note (Signed)
  Reviewed medical record and discussed patient presentation with Dr. Nelly Rout. Patient appears to be experiencing paranoid delusions.  Patient endorses recent use of methamphetamines, alcohol and marijuana.  Urine drug screen positive for amphetamines.  Laboratory results reviewed. Patient would benefit from antipsychotic medication to stabilize mood.   Recommend initiate: -Zyprexa 5 mg twice daily

## 2019-12-22 ENCOUNTER — Other Ambulatory Visit: Payer: Self-pay

## 2019-12-22 ENCOUNTER — Inpatient Hospital Stay (HOSPITAL_COMMUNITY)
Admission: AD | Admit: 2019-12-22 | Discharge: 2019-12-27 | DRG: 885 | Disposition: A | Discharge status: Home / Self Care | Payer: Federal, State, Local not specified - Other | Source: Other Acute Inpatient Hospital | Attending: Psychiatry | Admitting: Psychiatry

## 2019-12-22 ENCOUNTER — Encounter (HOSPITAL_COMMUNITY): Payer: Self-pay | Admitting: Adult Health

## 2019-12-22 DIAGNOSIS — Z23 Encounter for immunization: Secondary | ICD-10-CM

## 2019-12-22 DIAGNOSIS — F19959 Other psychoactive substance use, unspecified with psychoactive substance-induced psychotic disorder, unspecified: Secondary | ICD-10-CM | POA: Diagnosis not present

## 2019-12-22 DIAGNOSIS — G4700 Insomnia, unspecified: Secondary | ICD-10-CM | POA: Diagnosis present

## 2019-12-22 DIAGNOSIS — F17210 Nicotine dependence, cigarettes, uncomplicated: Secondary | ICD-10-CM | POA: Diagnosis present

## 2019-12-22 DIAGNOSIS — F15259 Other stimulant dependence with stimulant-induced psychotic disorder, unspecified: Secondary | ICD-10-CM | POA: Diagnosis present

## 2019-12-22 DIAGNOSIS — F209 Schizophrenia, unspecified: Secondary | ICD-10-CM | POA: Diagnosis present

## 2019-12-22 DIAGNOSIS — F411 Generalized anxiety disorder: Secondary | ICD-10-CM | POA: Diagnosis present

## 2019-12-22 DIAGNOSIS — Z79899 Other long term (current) drug therapy: Secondary | ICD-10-CM

## 2019-12-22 MED ORDER — LORAZEPAM 1 MG PO TABS
1.0000 mg | ORAL_TABLET | ORAL | Status: DC | PRN
  Filled 2019-12-22 (×2): qty 1

## 2019-12-22 MED ORDER — ONDANSETRON HCL 4 MG PO TABS
4.0000 mg | ORAL_TABLET | Freq: Three times a day (TID) | ORAL | Status: DC | PRN
  Administered 2019-12-26: 4 mg via ORAL
  Filled 2019-12-22: qty 1

## 2019-12-22 MED ORDER — OLANZAPINE 10 MG PO TBDP
10.0000 mg | ORAL_TABLET | Freq: Three times a day (TID) | ORAL | Status: DC | PRN
  Administered 2019-12-25: 10 mg via ORAL
  Filled 2019-12-22: qty 1

## 2019-12-22 MED ORDER — ZIPRASIDONE MESYLATE 20 MG IM SOLR: 20.0000 mg | INTRAMUSCULAR | Status: DC | PRN

## 2019-12-22 MED ORDER — GABAPENTIN 300 MG PO CAPS: 300.0000 mg | ORAL_CAPSULE | Freq: Two times a day (BID) | ORAL | Status: DC

## 2019-12-22 MED ORDER — ALUM & MAG HYDROXIDE-SIMETH 200-200-20 MG/5ML PO SUSP
30.0000 mL | Freq: Four times a day (QID) | ORAL | Status: DC | PRN
  Administered 2019-12-25: 30 mL via ORAL
  Filled 2019-12-22: qty 30

## 2019-12-22 MED ORDER — INFLUENZA VAC SPLIT QUAD 0.5 ML IM SUSY
0.5000 mL | PREFILLED_SYRINGE | INTRAMUSCULAR | Status: DC
  Filled 2019-12-22: qty 0.5

## 2019-12-22 MED ORDER — PNEUMOCOCCAL VAC POLYVALENT 25 MCG/0.5ML IJ INJ
0.5000 mL | INJECTION | INTRAMUSCULAR | Status: DC
  Filled 2019-12-22: qty 0.5

## 2019-12-22 MED ORDER — GABAPENTIN 300 MG PO CAPS
300.0000 mg | ORAL_CAPSULE | Freq: Two times a day (BID) | ORAL | Status: DC
  Administered 2019-12-22 – 2019-12-23 (×2): 300 mg via ORAL
  Filled 2019-12-22 (×5): qty 1

## 2019-12-22 MED ORDER — ACETAMINOPHEN 325 MG PO TABS
650.0000 mg | ORAL_TABLET | ORAL | Status: DC | PRN
  Administered 2019-12-25 – 2019-12-26 (×2): 650 mg via ORAL
  Filled 2019-12-22 (×2): qty 2

## 2019-12-22 MED ORDER — TRAZODONE HCL 50 MG PO TABS: 50.0000 mg | ORAL_TABLET | Freq: Every evening | ORAL | Status: DC | PRN

## 2019-12-22 MED ORDER — OLANZAPINE 5 MG PO TABS
5.0000 mg | ORAL_TABLET | Freq: Two times a day (BID) | ORAL | Status: DC
  Administered 2019-12-22 – 2019-12-23 (×2): 5 mg via ORAL
  Filled 2019-12-22 (×2): qty 1
  Filled 2019-12-22: qty 2
  Filled 2019-12-22 (×2): qty 1

## 2019-12-22 NOTE — ED Notes (Signed)
Patient discharged safely with Safe Transport.  All belongings were sent with patient.

## 2019-12-22 NOTE — Progress Notes (Signed)
Pt accepted to Room 507-02 at [facility] to the service of MD Lucianne Muss.  Report may be called to [phone] when transportation has been arranged
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
## 2019-12-22 NOTE — ED Provider Notes (Addendum)
Emergency Medicine Observation Re-evaluation Note  Marquis Down is a 29 y.o. male, seen on rounds today.  Pt initially presented to the ED for complaints of Abdominal Pain, Suicidal, and Hallucinations Currently, the patient is laying in bed.  Physical Exam  BP 96/66 (BP Location: Right Arm)   Pulse (!) 55   Temp 97.7 F (36.5 C) (Oral)   Resp 18   Ht 5\' 4"  (1.626 m)   Wt 72.1 kg   SpO2 100%   BMI 27.29 kg/m  Physical Exam General: well appearing Cardiac: normal rate Lungs: no increased work of breathing, no abnormal lung sounds Psych: calm, cooperative  ED Course / MDM  EKG:    I have reviewed the labs performed to date as well as medications administered while in observation.  Recent changes in the last 24 hours include none.  Plan  Current plan is for awaiting inpatient placement. Patient is not under full IVC at this time.  14:52  Pt has been accepted to [facility] by Dr. [facility], MD 12/22/19 02/21/20    [id number], MD 12/22/19 1452

## 2019-12-22 NOTE — Consult Note (Addendum)
Patient seen by this Clinical research associate and staffed with Dr. Jannifer Franklin.  Interpretor used as patient speaks [place].  Based on record review, he has improved since starting zyprexa yesterday and the patient acknowledges this as well.  However, he continues to endorse "witches in my stomach causing problems and complications" and is not at baseline.  He agrees to inpatient hospitalization to continue to work towards mood stability.    Patient seen face-to-face for psychiatric evaluation, chart reviewed and case discussed with the physician extender and developed treatment plan. Reviewed the information documented and agree with the treatment plan. Thedore Mins, MD

## 2019-12-23 DIAGNOSIS — F209 Schizophrenia, unspecified: Principal | ICD-10-CM | POA: Clinically undetermined

## 2019-12-23 DIAGNOSIS — F19959 Other psychoactive substance use, unspecified with psychoactive substance-induced psychotic disorder, unspecified: Secondary | ICD-10-CM

## 2019-12-23 LAB — LIPID PANEL
Cholesterol: 153 mg/dL (ref 0–200)
HDL: 37 mg/dL — ABNORMAL LOW (ref >40)
LDL Cholesterol: 82 mg/dL (ref 0–99)
Total CHOL/HDL Ratio: 4.1 RATIO
Triglycerides: 169 mg/dL — ABNORMAL HIGH (ref <150)
VLDL: 34 mg/dL (ref 0–40)

## 2019-12-23 MED ORDER — PANTOPRAZOLE SODIUM 40 MG PO TBEC
40.0000 mg | DELAYED_RELEASE_TABLET | Freq: Two times a day (BID) | ORAL | Status: DC
  Administered 2019-12-23 – 2019-12-25 (×6): 40 mg via ORAL
  Filled 2019-12-23 (×9): qty 1

## 2019-12-23 MED ORDER — BENZTROPINE MESYLATE 1 MG/ML IJ SOLN: 2.0000 mg | Freq: Two times a day (BID) | INTRAMUSCULAR | Status: DC | PRN

## 2019-12-23 MED ORDER — RISPERIDONE 3 MG PO TABS
3.0000 mg | ORAL_TABLET | Freq: Every day | ORAL | Status: DC
  Administered 2019-12-23: 3 mg via ORAL
  Filled 2019-12-23 (×2): qty 1

## 2019-12-23 MED ORDER — PALIPERIDONE PALMITATE ER 156 MG/ML IM SUSY
156.0000 mg | PREFILLED_SYRINGE | Freq: Once | INTRAMUSCULAR | Status: DC
  Filled 2019-12-23: qty 1

## 2019-12-23 MED ORDER — BENZTROPINE MESYLATE 1 MG PO TABS: 2.0000 mg | ORAL_TABLET | Freq: Two times a day (BID) | ORAL | Status: DC | PRN

## 2019-12-23 MED ORDER — PALIPERIDONE PALMITATE ER 234 MG/1.5ML IM SUSY: 234.0000 mg | PREFILLED_SYRINGE | INTRAMUSCULAR | Status: DC

## 2019-12-23 MED ORDER — RISPERIDONE 1 MG PO TABS
1.0000 mg | ORAL_TABLET | ORAL | Status: AC
  Administered 2019-12-23: 1 mg via ORAL
  Filled 2019-12-23: qty 1

## 2019-12-23 MED ORDER — PALIPERIDONE PALMITATE ER 234 MG/1.5ML IM SUSY
234.0000 mg | PREFILLED_SYRINGE | Freq: Once | INTRAMUSCULAR | Status: AC
  Administered 2019-12-24: 234 mg via INTRAMUSCULAR
  Filled 2019-12-23: qty 1.5

## 2019-12-23 NOTE — Progress Notes (Signed)
°   12/23/19 2241  Psych Admission Type (Psych Patients Only)  Admission Status Voluntary  Psychosocial Assessment  Patient Complaints Anxiety  Eye Contact Fair  Facial Expression Blank  Affect Appropriate to circumstance  Speech Incoherent  Interaction Isolative  Motor Activity Lethargic;Slow  Appearance/Hygiene In scrubs  Behavior Characteristics Cooperative  Mood Depressed  Thought Process  Coherency Circumstantial  Content Delusions  Delusions Paranoid;Persecutory  Perception Illusions  Hallucination None reported or observed;Auditory  Judgment Impaired  Confusion Moderate  Danger to Self  Current suicidal ideation? Denies  Danger to Others  Danger to Others None reported or observed

## 2019-12-23 NOTE — H&P (Signed)
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 Schizophrenia (HCC) 12/23/2019  . 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 Psychosis (HCC) 05/04/2019  . Substance-induced psychotic disorder [facility])    Hospital provided Spanish interpreter used during assessment  History of Present Illness:  Jeorge Reister is a 29 y.o. male [place] male with a history of substance induced psychosis who presented to the emergency department on 12/20/2019 with bizarre behaviors.  Initial psychiatric evaluation from that encounter: Patient presents to the [facility] Emergency stating that witches are putting objects in to his body. He feels that the witches are targeting his bladder, kidneys, nose and intestines. The witches started doing this to him x1 year. Patient most recently started to hear voices from those various body parts as mentioned. He calls the witches by names, "[name]" and "[name]". [name] is the witch that does sexual things to him. [name] is a witch that cast evil spirits on him and tries to kill him. There are also witches that he refers to as "[name]" and "[name]". Patient repeats that he has been bewitched several times this year. He further explains that the witches are tracking him down and feels like he is living in a haunted house. He feels that the witches realize he is in a safe environment so this is making them angry. He repeats, "They are putting water on me" and "The witches are making me cold with the water".  As interpreter services continued to provide services it was mentioned that patient was very repetitive. Patient was asked about suicidal ideations and  denied. He denied a prior history of suicidal gestures and/or attempts. No history of self mutilating behaviors. Patient denies that he is depressed. He does reports symptoms of anxiety. Appetite is poor as he believes witches are poisoning him. His sleep routine is poor a he believes witches will try to attack him at night. No HI presently. However, has experienced past thoughts of wanting to kill the witches. He thought about killing the witches just 2 days ago  Denies history of aggressive and assaultive behaviors. Denies legal issues. He has a history of methamphetamine, alcohol, and THC use. Last use of all substances was 12/20/19.  Patient received Zyprexa in the emergency department prior to this assessment.  On evaluation, patient is calm and cooperative with some bizarre paranoid behaviors.  He has difficulty making eye contact and appears to be responding to internal stimuli.  He complains of stomach planes and heartburn, but denies which is being present.  Patient describes that [facility] he took the medication that was prescribed to him for that month, and did not have any auditory hallucinations or concerns about which is.  However since that time, he has not continued medication.  He did not make his follow-up appointment.  He today endorses that he has only been smoking marijuana, and does not know how his urine drug screen could be positive for amphetamines.  He does admit to using amphetamines in the past, but reports it has been more than 1 week ago.  Patient states that he lives alone and has been working many jobs.  He is interested in restarting the medications that were prescribed at his last hospitalization.  Discussed long-acting  injectable antipsychotic for which he is agreeable.  Patient requests medicine to help with his heartburn symptoms.  He denies any suicidal or homicidal ideation.   Later in the day room, he expresses his  concerns about the which is in his stomach to nursing staff.  Associated Signs/Symptoms: Depression Symptoms:  insomnia, psychomotor agitation, (Hypo) Manic Symptoms:  Delusions, Anxiety Symptoms:  Excessive Worry, Psychotic Symptoms:  Delusions, PTSD Symptoms: NA Total Time spent with patient: 45 minutes  Past Psychiatric History: Vague reports of substance abuse but will not elaborate  Is the patient at risk to self? Yes.    Has the patient been a risk to self in the past 6 months? No.  Has the patient been a risk to self within the distant past? No.  Is the patient a risk to others? No.  Has the patient been a risk to others in the past 6 months? No.  Has the patient been a risk to others within the distant past? No.   Prior Inpatient Therapy:   Presentation 04/2019: Per notes, patient was speaking English in February.  He now requires a [place] interpreter.  He had been employed by a [employer], he had presented to the front desk of a [employer] with a Bible, wrapped in a towel, elaborating he was a prophet to take people "to hell" when interviewed with a translator much of his statements were extremely vague and had to be repeated-but he was very guarded he did acknowledge that he had "taken something" and had been drinking alcohol, and it was clear he was paranoid but he denied hallucinations, kept requesting to go, and provided minimal information.  He was discharged from that hospitalization on Risperdal 2 mg twice daily and temazepam 15 mg at bedtime. Prior Outpatient Therapy:  Patient did not go to follow-up appointment.  Alcohol Screening: 1. How often do you have a drink containing alcohol?: 2 to 3 times a week 2. How many drinks containing alcohol do you have on a typical day when you are drinking?: 7, 8, or 9 3. How often do you have six or more drinks on one occasion?: Daily or almost daily AUDIT-C Score: 10 4. How often during the last year have you found that you  were not able to stop drinking once you had started?: Weekly 5. How often during the last year have you failed to do what was normally expected from you because of drinking?: Less than monthly 6. How often during the last year have you needed a first drink in the morning to get yourself going after a heavy drinking session?: Never 7. How often during the last year have you had a feeling of guilt of remorse after drinking?: Daily or almost daily 8. How often during the last year have you been unable to remember what happened the night before because you had been drinking?: Weekly 9. Have you or someone else been injured as a result of your drinking?: No 10. Has a relative or friend or a doctor or another health worker been concerned about your drinking or suggested you cut down?: Yes, but not in the last year Alcohol Use Disorder Identification Test Final Score (AUDIT): 23 Substance Abuse History in the last 12 months:  Yes.   Consequences of Substance Abuse: Medical Consequences:  Probable substance-induced psychosis versus schizophrenic condition Previous Psychotropic Medications: Yes  Psychological Evaluations: No  Past Medical History: History reviewed. No pertinent past medical history.  Family History: History reviewed. No pertinent  family history. Family Psychiatric  History: Unknown patient states he has no family in the states Tobacco Screening: Have you used any form of tobacco in the last 30 days? (Cigarettes, Smokeless Tobacco, Cigars, and/or Pipes): Yes Tobacco use, Select all that apply: 5 or more cigarettes per day Are you interested in Tobacco Cessation Medications?: No, patient refused Counseled patient on smoking cessation including recognizing danger situations, developing coping skills and basic information about quitting provided: Yes Social History:  Social History   Substance and Sexual Activity  Alcohol Use Yes     Social History   Substance and Sexual Activity   Drug Use Not Currently  . Types: Methamphetamines, Marijuana    Additional Social History:           States he is living alone and working many jobs  Endorses using marijuana, but denies other substances                Allergies:  No Known Allergies Lab Results:  No results found for this or any previous visit (from the past 48 hour(s)).  Blood Alcohol level:  Lab Results  Component Value Date   ETH <10 12/20/2019   ETH <10 05/04/2019    Metabolic Disorder Labs:  No results found for: HGBA1C, MPG No results found for: PROLACTIN No results found for: CHOL, TRIG, HDL, CHOLHDL, VLDL, LDLCALC  Current Medications: Current Facility-Administered Medications  Medication Dose Route Frequency Provider Last Rate Last Admin  . acetaminophen (TYLENOL) tablet 650 mg  650 mg Oral Q4H PRN Chales Abrahams, NP      . alum & mag hydroxide-simeth (MAALOX/MYLANTA) 200-200-20 MG/5ML suspension 30 mL  30 mL Oral Q6H PRN Ophelia Shoulder E, NP      . benztropine (COGENTIN) tablet 2 mg  2 mg Oral BID PRN Mariel Craft, MD       Or  . benztropine mesylate (COGENTIN) injection 2 mg  2 mg Intramuscular BID PRN Mariel Craft, MD      . influenza vac split quadrivalent PF (FLUARIX) injection 0.5 mL  0.5 mL Intramuscular Tomorrow-1000 Mariel Craft, MD      . OLANZapine zydis [facility]) disintegrating tablet 10 mg  10 mg Oral Q8H PRN Mariel Craft, MD       And  . LORazepam (ATIVAN) tablet 1 mg  1 mg Oral PRN Mariel Craft, MD       And  . ziprasidone (GEODON) injection 20 mg  20 mg Intramuscular PRN Mariel Craft, MD      . ondansetron [facility]) tablet 4 mg  4 mg Oral Q8H PRN Chales Abrahams, NP      . Melene Muller ON 12/24/2019] paliperidone (INVEGA SUSTENNA) injection 234 mg  234 mg Intramuscular Once Mariel Craft, MD       Followed by  . [START ON 12/27/2019] paliperidone (INVEGA SUSTENNA) injection 156 mg  156 mg Intramuscular Once Mariel Craft, MD       Followed by  .  [START ON 01/24/2020] paliperidone (INVEGA SUSTENNA) injection 234 mg  234 mg Intramuscular Q28 days Mariel Craft, MD      . pantoprazole (PROTONIX) EC tablet 40 mg  40 mg Oral BID AC Mariel Craft, MD   40 mg at 12/23/19 1219  . pneumococcal 23 valent vaccine (PNEUMOVAX-23) injection 0.5 mL  0.5 mL Intramuscular Tomorrow-1000 Mariel Craft, MD      . risperiDONE (RISPERDAL) tablet 3 mg  3 mg Oral QHS  Mariel Craft, MD      . traZODone (DESYREL) tablet 50 mg  50 mg Oral QHS PRN Chales Abrahams, NP       PTA Medications: Medications Prior to Admission  Medication Sig Dispense Refill Last Dose  . risperiDONE (RISPERDAL) 2 MG tablet Take 1 tablet (2 mg total) by mouth 2 (two) times daily. (Patient not taking: Reported on 12/20/2019) 60 tablet 0   . temazepam (RESTORIL) 15 MG capsule Take 1 capsule (15 mg total) by mouth at bedtime. (Patient not taking: Reported on 12/20/2019) 15 capsule 0     Musculoskeletal: Strength & Muscle Tone: within normal limits Gait & Station: normal Patient leans: N/A  Psychiatric Specialty Exam: Physical Exam Vitals reviewed.  Constitutional:      Appearance: Normal appearance. He is normal weight.  HENT:     Head: Normocephalic and atraumatic.     Nose: Nose normal.  Eyes:     Extraocular Movements: Extraocular movements intact.  Cardiovascular:     Rate and Rhythm: Normal rate.  Pulmonary:     Effort: Pulmonary effort is normal. No respiratory distress.  Musculoskeletal:        General: Normal range of motion.     Cervical back: Normal range of motion.  Neurological:     General: No focal deficit present.     Mental Status: He is alert.     Review of Systems  Constitutional: Negative.   Respiratory: Negative.   Cardiovascular: Negative.   Gastrointestinal: Positive for abdominal pain (heartburn, and witches in my stomach).  Musculoskeletal: Negative.   Neurological: Negative.   Psychiatric/Behavioral: Positive for confusion,  dysphoric mood, hallucinations and sleep disturbance. Negative for agitation, behavioral problems, decreased concentration, self-injury and suicidal ideas. The patient is nervous/anxious. The patient is not hyperactive.     Blood pressure 101/65, pulse 85, temperature 98.2 F (36.8 C), temperature source Oral, resp. rate 16, height 5\' 4"  (1.626 m), weight 73 kg, SpO2 100 %.Body mass index is 27.62 kg/m.  General Appearance: Bizarre and Disheveled  Eye Contact:  Minimal  Speech:  Slow  Volume:  Decreased  Mood:  Anxious and Dysphoric  Affect:  Blunt  Thought Process:  Descriptions of Associations: Circumstantial  Orientation:  Other:  Person and place, cannot state date with prompting  Thought Content:  Delusions, Hallucinations: Auditory Tactile Visual, Paranoid Ideation and Rumination  Suicidal Thoughts:  No  Homicidal Thoughts:  No  Memory:  Immediate;   Poor Recent;   Poor Remote;   Poor  Judgement:  Impaired  Insight:  Lacking  Psychomotor Activity:  Normal  Concentration:  Concentration: Poor and Attention Span: Poor  Recall:  Fair  Fund of Knowledge:  Fair  Language:  Content vague  Akathisia:  No  Handed:  Right  AIMS (if indicated):     Assets:  Desire for Improvement Physical Health Resilience Vocational/Educational  ADL's:  Intact  Cognition:  WNL  Sleep:  Number of Hours: 6.75    Treatment Plan Summary: Daily contact with patient to assess and evaluate symptoms and progress in treatment and Medication management  Observation Level/Precautions:  15 minute checks  Laboratory:  UDS positive for amphetamines, negative for marijuana despite patient stating this is the substance he has used most recently; salicylate, acetaminophen, ethanol levels unremarkable; blood lipase slightly elevated at 61; CBC within normal limits, CMP within normal limits with exception of glucose 122; will add hemoglobin A1c and lipid panel for medication monitoring.  TSH from 6 months ago  was within normal limits does not need to be repeated. ECG was completed in the emergency department but has not yet been released for review.  Psychotherapy: Encourage interaction Review in group therapy.  Request Spanish interpreter for group therapy  Medications: Begin Risperdal per patient request 1 mg today followed by Risperdal 3 mg at bedtime; start Invega Sustenna 234 mg first dose on 12/24/2019 followed by Hinda GlatterInvega Sustenna 156 mg on 12/27/2019 then [place] 234 mg every 28 days starting 01/24/2020; Cogentin 2 mg orally or IM as needed for EPS; start Protonix 40 mg twice daily before meals for reflux and abdominal pain symptoms; Zofran 4 mg every 8 hours as needed for nausea and vomiting; trazodone 50 mg at bedtime as needed for sleep; agitation protocol in place-Zyprexa 10 mg every 8 hours as needed; Ativan 1 mg once as needed; Geodon 20 mg IM as needed for severe agitation.  Consultations:    Discharge Concerns: Diagnostic clarity, long-term stability  Estimated LOS: 5-7 days  Other: Require Spanish translator in order to most benefit from time on unit Appreciate social work attempt to obtain collateral from coworker, employer, or any family members.  Patient does not state any body to contact him in regards to his care.    Physician Treatment Plan for Primary Diagnosis: Psychoactive substance-induced psychosis (HCC)  Long Term Goal(s): Improvement in symptoms so as ready for discharge  Short Term Goals: Ability to identify changes in lifestyle to reduce recurrence of condition will improve, Ability to verbalize feelings will improve, Ability to demonstrate self-control will improve, Ability to identify and develop effective coping behaviors will improve, Ability to maintain clinical measurements within normal limits will improve, Compliance with prescribed medications will improve and Ability to identify triggers associated with substance abuse/mental health issues will  improve  Physician Treatment Plan for Secondary Diagnosis: Principal Problem:   Psychoactive substance-induced psychosis (HCC) Active Problems:   Schizophrenia (HCC)  Long Term Goal(s): Improvement in symptoms so as ready for discharge  Short Term Goals: Ability to identify changes in lifestyle to reduce recurrence of condition will improve, Ability to verbalize feelings will improve, Ability to demonstrate self-control will improve, Ability to identify and develop effective coping behaviors will improve, Ability to maintain clinical measurements within normal limits will improve, Compliance with prescribed medications will improve and Ability to identify triggers associated with substance abuse/mental health issues will improve  I certify that inpatient services furnished can reasonably be expected to improve the patient's condition.    Mariel CraftSHEILA M Charlynn Salih, MD 10/3/20213:26 PM

## 2019-12-23 NOTE — Progress Notes (Signed)
   12/23/19 1500  Psych Admission Type (Psych Patients Only)  Admission Status Voluntary  Psychosocial Assessment  Patient Complaints Anxiety;Confusion;Disorientation;Isolation  Eye Contact Fair  Facial Expression Blank  Affect Appropriate to circumstance  Speech Incoherent  Interaction Isolative  Motor Activity Lethargic;Slow  Appearance/Hygiene In scrubs  Behavior Characteristics Cooperative  Mood Depressed;Pleasant  Thought Process  Coherency Circumstantial  Content Delusions  Delusions Paranoid;Persecutory  Perception Illusions  Hallucination None reported or observed;Auditory  Judgment Impaired  Confusion Moderate  Danger to Self  Current suicidal ideation? Denies  Danger to Others  Danger to Others None reported or observed

## 2019-12-23 NOTE — Progress Notes (Signed)
   12/23/19 0122  Psych Admission Type (Psych Patients Only)  Admission Status Voluntary  Psychosocial Assessment  Patient Complaints Depression  Eye Contact Fair  Facial Expression Anxious  Affect Appropriate to circumstance  Speech Logical/coherent;Soft  Interaction Defensive  Motor Activity Fidgety;Restless  Appearance/Hygiene In scrubs  Behavior Characteristics Cooperative  Mood Depressed;Pleasant  Thought Process  Coherency Concrete thinking  Content Delusions  Delusions Persecutory;Paranoid  Perception Hallucinations  Hallucination Auditory  Judgment Impaired  Confusion Moderate  Danger to Self  Current suicidal ideation? Denies  Danger to Others  Danger to Others None reported or observed

## 2019-12-23 NOTE — Progress Notes (Signed)
Pt complains of pain to abdomen at 3/10, bowel sounds X 4 present WNL, area non-tender with know obstructions palpated. Translator used. LBM yesterday, Pt given meal with no complaints of pains.   Einar Crow. Melvyn Neth MSN, RN, [facility] [phone]

## 2019-12-23 NOTE — Progress Notes (Signed)

## 2019-12-23 NOTE — [facility] Group Notes (Signed)
Pt did not attend wrap up group this evening. Pt was sleeping in bed.

## 2019-12-23 NOTE — [facility] Suicide Risk Assessment (Signed)
[facility] Admission Suicide Risk Assessment   Nursing information obtained from:  Patient Demographic factors:  Male, Adolescent or young adult, Low socioeconomic status Current Mental Status:  Plan to harm others ("the witches in my stomach are telling me to hurt others") Loss Factors:  Decrease in vocational status, Financial problems / change in socioeconomic status Historical Factors:  Impulsivity Risk Reduction Factors:  Religious beliefs about death, Employed  Total Time spent with patient: 1 hour Principal Problem: Psychoactive substance-induced psychosis (HCC) Diagnosis:  Principal Problem:   Psychoactive substance-induced psychosis (HCC) Active Problems:   Schizophrenia (HCC)  Subjective Data: "I am having a lot of stomach pain and heartburn"  Hospital provided Spanish interpreter used during assessment  History of Present Illness:  Kevin Brown is a 29 y.o. male [place] male with a history of substance induced psychosis who presented to the emergency department on 12/20/2019 with bizarre behaviors.  Initial psychiatric evaluation from that encounter: Patientpresents to the [facility] stating that witches are putting objects in to his body. He feels that the witches are targeting his bladder, kidneys, nose and intestines. The witches started doing this to him x1 year. Patient most recently started to hear voices from those various body parts as mentioned. He calls the witches by names, "[name]" and "[name]". [name] is the witch that does sexual things to him. [name] is a witch that cast evil spirits on him and tries to kill him. There are also witches that he refers to as "[name]" and "[name]". Patient repeats that he has been bewitched several times this year. He further explains that the witches are tracking him down and feels like he is living in a haunted house. He feels that the witches realize he is in a safe environment so this is making them  angry. He repeats, "They are putting water on me" and "The witches are making me cold with the water".  As interpreter services continued to provide services it was mentioned that patient was very repetitive. Patient was asked about suicidal ideations and denied. He denied a prior history of suicidal gestures and/or attempts. No history of self mutilating behaviors. Patient denies that he is depressed. He does reports symptoms of anxiety. Appetite is poor as he believes witches are poisoning him. His sleep routine is poor a he believes witches will try to attack him at night. No HI presently. However, has experienced past thoughts of wanting to kill the witches. He thought about killing the witches just 2 days ago Denies history of aggressive and assaultive behaviors. Denies legal issues. He has a history of methamphetamine, alcohol, and THC use. Last use of all substances was 12/20/19.  Patient received Zyprexa in the emergency department prior to this assessment.  On evaluation, patient is calm and cooperative with some bizarre paranoid behaviors.  He has difficulty making eye contact and appears to be responding to internal stimuli.  He complains of stomach planes and heartburn, but denies which is being present.  Patient describes that [facility] he took the medication that was prescribed to him for that month, and did not have any auditory hallucinations or concerns about which is.  However since that time, he has not continued medication.  He did not make his follow-up appointment.  He today endorses that he has only been smoking marijuana, and does not know how his urine drug screen could be positive for amphetamines.  He does admit to using amphetamines  in the past, but reports it has been more than 1 week ago.  Patient states that he lives alone and has been working many jobs.  He is interested in restarting the medications that were prescribed at  his last hospitalization.  Discussed long-acting injectable antipsychotic for which he is agreeable.  Patient requests medicine to help with his heartburn symptoms.  He denies any suicidal or homicidal ideation.   Later in the day room, he expresses his concerns about the which is in his stomach to nursing staff.  Associated Signs/Symptoms: Depression Symptoms:  insomnia, psychomotor agitation, (Hypo) Manic Symptoms:  Delusions, Anxiety Symptoms:  Excessive Worry, Psychotic Symptoms:  Delusions, PTSD Symptoms: NA Total Time spent with patient: 45 minutes  Past Psychiatric History: Vague reports of substance abuse but will not elaborate  Is the patient at risk to self? Yes.    Has the patient been a risk to self in the past 6 months? No.  Has the patient been a risk to self within the distant past? No.  Is the patient a risk to others? No.  Has the patient been a risk to others in the past 6 months? No.  Has the patient been a risk to others within the distant past? No.   Prior Inpatient Therapy:   Presentation 04/2019: Per notes, patient was speaking English in February.  He now requires a [place] interpreter.  He had been employed by a [employer] company, he had presented to the front desk of a [employer] hotel with a Bible, wrapped in a towel, elaborating he was a prophet to take people "to hell" when interviewed with a translator much of his statements were extremely vague and had to be repeated-but he was very guarded he did acknowledge that he had "taken something" and had been drinking alcohol, and it was clear he was paranoid but he denied hallucinations, kept requesting to go, and provided minimal information.  He was discharged from that hospitalization on Risperdal 2 mg twice daily and temazepam 15 mg at bedtime. Prior Outpatient Therapy:  Patient did not go to follow-up appointment. Hospital provided Spanish interpreter used during assessment  History of Present Illness:  Kevin Brown is a 29 y.o. male [name] with a history of substance induced psychosis who presented to the emergency department on 12/20/2019 with bizarre behaviors.  Initial psychiatric evaluation from that encounter: [facility] stating that witches are putting objects in to his body. He feels that the witches are targeting his bladder, kidneys, nose and intestines. The witches started doing this to him x1 year. Patient most recently started to hear voices from those various body parts as mentioned. He calls the witches by names, "Kelby FamManuel" and "[name]". Kelby FamManuel is the witch that does sexual things to him. Delight HohMarcella is a witch that cast evil spirits on him and tries to kill him. There are also witches that he refers to as "[place] witches" and "[name]". Patient repeats that he has been bewitched several times this year. He further explains that the witches are tracking him down and feels like he is living in a haunted house. He feels that the witches realize he is in a safe environment so this is making them angry. He repeats, "They are putting water on me" and "The witches are making me cold with the water".  As interpreter services continued to provide services it was mentioned that patient was very repetitive. Patient was asked about suicidal ideations and denied. He denied  a prior history of suicidal gestures and/or attempts. No history of self mutilating behaviors. Patient denies that he is depressed. He does reports symptoms of anxiety. Appetite is poor as he believes witches are poisoning him. His sleep routine is poor a he believes witches will try to attack him at night. No HI presently. However, has experienced past thoughts of wanting to kill the witches. He thought about killing the witches just 2 days ago Denies history of aggressive and assaultive behaviors. Denies legal issues. He has a history of methamphetamine, alcohol, and THC use. Last use of all  substances was 12/20/19.  Patient received Zyprexa in the emergency department prior to this assessment.  On evaluation, patient is calm and cooperative with some bizarre paranoid behaviors.  He has difficulty making eye contact and appears to be responding to internal stimuli.  He complains of stomach planes and heartburn, but denies which is being present.  Patient describes that [facility] he took the medication that was prescribed to him for that month, and did not have any auditory hallucinations or concerns about which is.  However since that time, he has not continued medication.  He did not make his follow-up appointment.  He today endorses that he has only been smoking marijuana, and does not know how his urine drug screen could be positive for amphetamines.  He does admit to using amphetamines in the past, but reports it has been more than 1 week ago.  Patient states that he lives alone and has been working many jobs.  He is interested in restarting the medications that were prescribed at his last hospitalization.  Discussed long-acting injectable antipsychotic for which he is agreeable.  Patient requests medicine to help with his heartburn symptoms.  He denies any suicidal or homicidal ideation.   Later in the day room, he expresses his concerns about the which is in his stomach to nursing staff.  Associated Signs/Symptoms: Depression Symptoms:  insomnia, psychomotor agitation, (Hypo) Manic Symptoms:  Delusions, Anxiety Symptoms:  Excessive Worry, Psychotic Symptoms:  Delusions, PTSD Symptoms: NA Total Time spent with patient: 45 minutes  Past Psychiatric History: Vague reports of substance abuse but will not elaborate  Is the patient at risk to self? Yes.    Has the patient been a risk to self in the past 6 months? No.  Has the patient been a risk to self within the distant past? No.  Is the patient a risk to others? No.  Has  the patient been a risk to others in the past 6 months? No.  Has the patient been a risk to others within the distant past? No.   Prior Inpatient Therapy:   Presentation 04/2019: Per notes, patient was speaking English in February.  He now requires a [place] interpreter.  He had been employed by a [employer] bre[employer], he had presented to the front desk of a [employer] with a Bible, wrapped in a towel, elaborating he was a prophet to take people "to hell" when interviewed with a translator much of his statements were extremely vague and had to be repeated-but he was very guarded he did acknowledge that he had "taken something" and had been drinking alcohol, and it was clear he was paranoid but he denied hallucinations, kept requesting to go, and provided minimal information.  He was discharged from that hospitalization on Risperdal 2 mg twice daily and temazepam 15 mg at bedtime. Prior Outpatient Therapy:  Patient did  not go to follow-up appointment.   Continued Clinical Symptoms:  Alcohol Use Disorder Identification Test Final Score (AUDIT): 23 The "Alcohol Use Disorders Identification Test", Guidelines for Use in Primary Care, Second Edition.  World Science writer [facility]). Score between 0-7:  no or low risk or alcohol related problems. Score between 8-15:  moderate risk of alcohol related problems. Score between 16-19:  high risk of alcohol related problems. Score 20 or above:  warrants further diagnostic evaluation for alcohol dependence and treatment.   CLINICAL FACTORS:   Alcohol/Substance Abuse/Dependencies Schizophrenia:   Paranoid or undifferentiated type Currently Psychotic   Musculoskeletal: Strength & Muscle Tone: within normal limits Gait & Station: normal Patient leans: N/A  Psychiatric Specialty Exam: Physical Exam Vitals reviewed.  Constitutional:      Appearance: Normal appearance. He is normal weight.  HENT:     Head: Normocephalic and atraumatic.     Nose: Nose  normal.  Eyes:     Extraocular Movements: Extraocular movements intact.  Cardiovascular:     Rate and Rhythm: Normal rate.  Pulmonary:     Effort: Pulmonary effort is normal. No respiratory distress.  Musculoskeletal:        General: Normal range of motion.     Cervical back: Normal range of motion.  Neurological:     General: No focal deficit present.     Mental Status: He is alert.     Review of Systems  Constitutional: Negative.   Respiratory: Negative.   Cardiovascular: Negative.   Gastrointestinal: Positive for abdominal pain (heartburn, and witches in my stomach).  Musculoskeletal: Negative.   Neurological: Negative.   Psychiatric/Behavioral: Positive for confusion, dysphoric mood, hallucinations and sleep disturbance. Negative for agitation, behavioral problems, decreased concentration, self-injury and suicidal ideas. The patient is nervous/anxious. The patient is not hyperactive.     Blood pressure 101/65, pulse 85, temperature 98.2 F (36.8 C), temperature source Oral, resp. rate 16, height 5\' 4"  (1.626 m), weight 73 kg, SpO2 100 %.Body mass index is 27.62 kg/m.  General Appearance: Bizarre and Disheveled  Eye Contact:  Minimal  Speech:  Slow  Volume:  Decreased  Mood:  Anxious and Dysphoric  Affect:  Blunt  Thought Process:  Descriptions of Associations: Circumstantial  Orientation:  Other:  Person and place, cannot state date with prompting  Thought Content:  Delusions, Hallucinations: Auditory Tactile Visual, Paranoid Ideation and Rumination  Suicidal Thoughts:  No  Homicidal Thoughts:  No  Memory:  Immediate;   Poor Recent;   Poor Remote;   Poor  Judgement:  Impaired  Insight:  Lacking  Psychomotor Activity:  Normal  Concentration:  Concentration: Poor and Attention Span: Poor  Recall:  of Knowledge:  Fair  Language:  Content vague  Akathisia:  No  Handed:  Right  AIMS (if indicated):     Assets:  Desire for Improvement Physical  Health Resilience Vocational/Educational  ADL's:  Intact  Cognition:  WNL  Sleep:  Number of Hours: 6.75     COGNITIVE FEATURES THAT CONTRIBUTE TO RISK:  Loss of executive function    SUICIDE RISK:   Mild:  Suicidal ideation of limited frequency, intensity, duration, and specificity.  There are no identifiable plans, no associated intent, mild dysphoria and related symptoms, good self-control (both objective and subjective assessment), few other risk factors, and identifiable protective factors, including available and accessible social support.  PLAN OF CARE:  Treatment Plan Summary: Daily contact with patient to assess and evaluate symptoms and progress in treatment  and Medication management  Observation Level/Precautions:  15 minute checks  Laboratory:  UDS positive for amphetamines, negative for marijuana despite patient stating this is the substance he has used most recently; salicylate, acetaminophen, ethanol levels unremarkable; blood lipase slightly elevated at 61; CBC within normal limits, CMP within normal limits with exception of glucose 122; will add hemoglobin A1c and lipid panel for medication monitoring.  TSH from 6 months ago was within normal limits does not need to be repeated. ECG was completed in the emergency department but has not yet been released for review.  Psychotherapy: Encourage interaction Review in group therapy.  Request Spanish interpreter for group therapy  Medications: Begin Risperdal per patient request 1 mg today followed by Risperdal 3 mg at bedtime; start Invega Sustenna 234 mg first dose on 12/24/2019 followed by Hinda Glatter Sustenna 156 mg on 12/27/2019 then [place] Sustenna 234 mg every 28 days starting 01/24/2020; Cogentin 2 mg orally or IM as needed for EPS; start Protonix 40 mg twice daily before meals for reflux and abdominal pain symptoms; Zofran 4 mg every 8 hours as needed for nausea and vomiting; trazodone 50 mg at bedtime as needed for sleep;  agitation protocol in place-Zyprexa 10 mg every 8 hours as needed; Ativan 1 mg once as needed; Geodon 20 mg IM as needed for severe agitation.  Consultations:    Discharge Concerns: Diagnostic clarity, long-term stability  Estimated LOS: 5-7 days  Other: Require Spanish translator in order to most benefit from time on unit Appreciate social work attempt to obtain collateral from coworker, employer, or any family members.  Patient does not state any body to contact him in regards to his care.    Physician Treatment Plan for Primary Diagnosis: Psychoactive substance-induced psychosis (HCC)  Long Term Goal(s): Improvement in symptoms so as ready for discharge  Short Term Goals: Ability to identify changes in lifestyle to reduce recurrence of condition will improve, Ability to verbalize feelings will improve, Ability to demonstrate self-control will improve, Ability to identify and develop effective coping behaviors will improve, Ability to maintain clinical measurements within normal limits will improve, Compliance with prescribed medications will improve and Ability to identify triggers associated with substance abuse/mental health issues will improve  Physician Treatment Plan for Secondary Diagnosis: Principal Problem:   Psychoactive substance-induced psychosis (HCC) Active Problems:   Schizophrenia (HCC)  Long Term Goal(s): Improvement in symptoms so as ready for discharge  Short Term Goals: Ability to identify changes in lifestyle to reduce recurrence of condition will improve, Ability to verbalize feelings will improve, Ability to demonstrate self-control will improve, Ability to identify and develop effective coping behaviors will improve, Ability to maintain clinical measurements within normal limits will improve, Compliance with prescribed medications will improve and Ability to identify triggers associated with substance abuse/mental health issues will improve    I certify that  inpatient services furnished can reasonably be expected to improve the patient's condition.   Mariel Craft, MD 12/23/2019, 3:49 PM

## 2019-12-23 NOTE — [facility] Group Notes (Signed)
[facility] LCSW Group Therapy Note  Date/Time:  12/23/2019  11:00AM-12:00PM  Type of Therapy and Topic:  Group Therapy:  Music and Mood  Participation Level:  Did Not Attend   Description of Group: In this process group, members listened to a variety of genres of music and identified that different types of music evoke different responses.  Patients were encouraged to identify music that was soothing for them and music that was energizing for them.  Patients discussed how this knowledge can help with wellness and recovery in various ways including managing depression and anxiety as well as encouraging healthy sleep habits.    Therapeutic Goals: Patients will explore the impact of different varieties of music on mood Patients will verbalize the thoughts they have when listening to different types of music Patients will identify music that is soothing to them as well as music that is energizing to them Patients will discuss how to use this knowledge to assist in maintaining wellness and recovery Patients will explore the use of music as a coping skill  Summary of Patient Progress:  N/A  Therapeutic Modalities: Solution Focused Brief Therapy Activity   Tanysha Quant Grossman-Orr, LCSW    

## 2019-12-24 LAB — HEMOGLOBIN A1C
Hgb A1c MFr Bld: 5.7 % — ABNORMAL HIGH (ref 4.8–5.6)
Mean Plasma Glucose: 116.89 mg/dL

## 2019-12-24 LAB — TSH: TSH: 3.325 u[IU]/mL (ref 0.350–4.500)

## 2019-12-24 MED ORDER — LORAZEPAM 1 MG PO TABS
1.0000 mg | ORAL_TABLET | Freq: Four times a day (QID) | ORAL | Status: DC | PRN
  Administered 2019-12-26: 1 mg via ORAL

## 2019-12-24 MED ORDER — TRAZODONE HCL 150 MG PO TABS
150.0000 mg | ORAL_TABLET | Freq: Every evening | ORAL | Status: DC | PRN
  Administered 2019-12-24 – 2019-12-26 (×3): 150 mg via ORAL
  Filled 2019-12-24 (×3): qty 1

## 2019-12-24 MED ORDER — RISPERIDONE 2 MG PO TABS
4.0000 mg | ORAL_TABLET | Freq: Every day | ORAL | Status: DC
  Administered 2019-12-24: 4 mg via ORAL
  Filled 2019-12-24 (×2): qty 2

## 2019-12-24 NOTE — Progress Notes (Signed)
RN was unable to get translator machine working as it would not connect to a Nurse, learning disability after several attempts (at least 5 times).  RN utilized a  fluent spanish speaking staff member to translate what pt said to this RN.    Pt denied SI/HI/AVH.  Pt endorsed auditory hallucinations and said the witch was "still there, but going away some."  Pt is calm throughout the day and it appears that pt has been in no distress.  RN took pt's vital signs and administered pt's EKG.  Pt had no difficulty taking medications.   RN will continue to monitor and provide support as needed.

## 2019-12-24 NOTE — [facility] Suicide Risk Assessment (Signed)
[facility] INPATIENT:  Family/Significant Other Suicide Prevention Education  Suicide Prevention Education:  Patient Refusal for Family/Significant Other Suicide Prevention Education: The patient Kevin Brown has refused to provide written consent for family/significant other to be provided Family/Significant Other Suicide Prevention Education during admission and/or prior to discharge.  Physician notified.   SPE completed with patient, as patient refused to consent to family contact. SPI pamphlet provided to pt and pt was encouraged to share information with support network, ask questions, and talk about any concerns relating to SPE. Patient denies access to guns/firearms and verbalized understanding of information provided. Mobile Crisis information also provided to patient.   Ruthann Cancer MSW, LCSW Clincal Social Worker  [facility]

## 2019-12-24 NOTE — Progress Notes (Signed)
Recreation Therapy Notes  Date: 10.4.21 Time: 1000 Location: 500 Hall Dayroom  Group Topic: Coping Skills  Goal Area(s) Addresses:  Patient will identify positive coping skills. Patient will identify benefit of using coping skills post d/c.  Intervention: Worksheet  Activity: Healthy vs. Unhealthy Coping Strategies.  Patients were to identify a problem they are currently facing.  Patients were to then identify what healthy and unhealthy coping strategies used and the outcomes.  Education: Pharmacologist, Building control surveyor.   Education Outcome: Acknowledges understanding/In group clarification offered/Needs additional education.   Clinical Observations/Feedback: Pt did not attend group session.     Caroll Rancher, LRT/CTRS        Lillia Abed, Linden Mikes A 12/24/2019 12:00 PM

## 2019-12-24 NOTE — Tx Team (Signed)
Interdisciplinary Treatment and Diagnostic Plan Update  12/24/2019 Time of Session: 10:05 AM Kevin Brown MRN: [id number]  Principal Diagnosis: Psychoactive substance-induced psychosis ([place])  Secondary Diagnoses: Principal Problem:   Psychoactive substance-induced psychosis ([name]) Active Problems:   Schizophrenia ([name])   Current Medications:  Current Facility-Administered Medications  Medication Dose Route Frequency Provider Last Rate Last Admin  . acetaminophen (TYLENOL) tablet 650 mg  650 mg Oral Q4H PRN Mallie Darting, NP      . alum & mag hydroxide-simeth (MAALOX/MYLANTA) 200-200-20 MG/5ML suspension 30 mL  30 mL Oral Q6H PRN Merlyn Lot E, NP      . benztropine (COGENTIN) tablet 2 mg  2 mg Oral BID PRN Lavella Hammock, MD       Or  . benztropine mesylate (COGENTIN) injection 2 mg  2 mg Intramuscular BID PRN Lavella Hammock, MD      . influenza vac split quadrivalent PF (FLUARIX) injection 0.5 mL  0.5 mL Intramuscular Tomorrow-1000 Lavella Hammock, MD      . OLANZapine zydis [facility]) disintegrating tablet 10 mg  10 mg Oral Q8H PRN Lavella Hammock, MD       And  . LORazepam (ATIVAN) tablet 1 mg  1 mg Oral PRN Lavella Hammock, MD       And  . ziprasidone (GEODON) injection 20 mg  20 mg Intramuscular PRN Lavella Hammock, MD      . ondansetron [facility]) tablet 4 mg  4 mg Oral Q8H PRN Merlyn Lot E, NP      . paliperidone (INVEGA SUSTENNA) injection 234 mg  234 mg Intramuscular Once Lavella Hammock, MD       Followed by  . [START ON 12/27/2019] paliperidone (INVEGA SUSTENNA) injection 156 mg  156 mg Intramuscular Once Lavella Hammock, MD       Followed by  . [START ON 01/24/2020] paliperidone (INVEGA SUSTENNA) injection 234 mg  234 mg Intramuscular Q28 days Lavella Hammock, MD      . pantoprazole (PROTONIX) EC tablet 40 mg  40 mg Oral BID AC Lavella Hammock, MD   40 mg at 12/24/19 0715  . pneumococcal 23 valent vaccine (PNEUMOVAX-23) injection 0.5 mL  0.5 mL  Intramuscular Tomorrow-1000 Lavella Hammock, MD      . risperiDONE (RISPERDAL) tablet 3 mg  3 mg Oral QHS Lavella Hammock, MD   3 mg at 12/23/19 2112  . traZODone (DESYREL) tablet 50 mg  50 mg Oral QHS PRN Mallie Darting, NP       PTA Medications: Medications Prior to Admission  Medication Sig Dispense Refill Last Dose  . risperiDONE (RISPERDAL) 2 MG tablet Take 1 tablet (2 mg total) by mouth 2 (two) times daily. (Patient not taking: Reported on 12/20/2019) 60 tablet 0   . temazepam (RESTORIL) 15 MG capsule Take 1 capsule (15 mg total) by mouth at bedtime. (Patient not taking: Reported on 12/20/2019) 15 capsule 0     Patient Stressors: Financial difficulties Medication change or noncompliance Substance abuse  Patient Strengths: Capable of independent living Religious Affiliation Work skills  Treatment Modalities: Medication Management, Group therapy, Case management,  1 to 1 session with clinician, Psychoeducation, Recreational therapy.   Physician Treatment Plan for Primary Diagnosis: Psychoactive substance-induced psychosis ([name]) Long Term Goal(s): Improvement in symptoms so as ready for discharge Improvement in symptoms so as ready for discharge   Short Term Goals: Ability to identify changes in lifestyle to reduce recurrence of condition will  improve Ability to verbalize feelings will improve Ability to demonstrate self-control will improve Ability to identify and develop effective coping behaviors will improve Ability to maintain clinical measurements within normal limits will improve Compliance with prescribed medications will improve Ability to identify triggers associated with substance abuse/mental health issues will improve Ability to identify changes in lifestyle to reduce recurrence of condition will improve Ability to verbalize feelings will improve Ability to demonstrate self-control will improve Ability to identify and develop effective coping behaviors will  improve Ability to maintain clinical measurements within normal limits will improve Compliance with prescribed medications will improve Ability to identify triggers associated with substance abuse/mental health issues will improve  Medication Management: Evaluate patient's response, side effects, and tolerance of medication regimen.  Therapeutic Interventions: 1 to 1 sessions, Unit Group sessions and Medication administration.  Evaluation of Outcomes: Not Met  Physician Treatment Plan for Secondary Diagnosis: Principal Problem:   Psychoactive substance-induced psychosis ([name]) Active Problems:   Schizophrenia ([name])  Long Term Goal(s): Improvement in symptoms so as ready for discharge Improvement in symptoms so as ready for discharge   Short Term Goals: Ability to identify changes in lifestyle to reduce recurrence of condition will improve Ability to verbalize feelings will improve Ability to demonstrate self-control will improve Ability to identify and develop effective coping behaviors will improve Ability to maintain clinical measurements within normal limits will improve Compliance with prescribed medications will improve Ability to identify triggers associated with substance abuse/mental health issues will improve Ability to identify changes in lifestyle to reduce recurrence of condition will improve Ability to verbalize feelings will improve Ability to demonstrate self-control will improve Ability to identify and develop effective coping behaviors will improve Ability to maintain clinical measurements within normal limits will improve Compliance with prescribed medications will improve Ability to identify triggers associated with substance abuse/mental health issues will improve     Medication Management: Evaluate patient's response, side effects, and tolerance of medication regimen.  Therapeutic Interventions: 1 to 1 sessions, Unit Group sessions and Medication  administration.  Evaluation of Outcomes: Not Met   RN Treatment Plan for Primary Diagnosis: Psychoactive substance-induced psychosis ([name]) Long Term Goal(s): Knowledge of disease and therapeutic regimen to maintain health will improve  Short Term Goals: Ability to remain free from injury will improve, Ability to participate in decision making will improve, Ability to verbalize feelings will improve and Ability to identify and develop effective coping behaviors will improve  Medication Management: RN will administer medications as ordered by provider, will assess and evaluate patient's response and provide education to patient for prescribed medication. RN will report any adverse and/or side effects to prescribing provider.  Therapeutic Interventions: 1 on 1 counseling sessions, Psychoeducation, Medication administration, Evaluate responses to treatment, Monitor vital signs and CBGs as ordered, Perform/monitor CIWA, COWS, AIMS and Fall Risk screenings as ordered, Perform wound care treatments as ordered.  Evaluation of Outcomes: Not Met   LCSW Treatment Plan for Primary Diagnosis: Psychoactive substance-induced psychosis ([place]) Long Term Goal(s): Safe transition to appropriate next level of care at discharge, Engage patient in therapeutic group addressing interpersonal concerns.  Short Term Goals: Engage patient in aftercare planning with referrals and resources, Increase ability to appropriately verbalize feelings, Increase emotional regulation, Facilitate acceptance of mental health diagnosis and concerns and Increase skills for wellness and recovery  Therapeutic Interventions: Assess for all discharge needs, 1 to 1 time with Social worker, Explore available resources and support systems, Assess for adequacy in community support network, Educate family and significant  other(s) on suicide prevention, Complete Psychosocial Assessment, Interpersonal group therapy.  Evaluation of Outcomes: Not  Met   Progress in Treatment: Attending groups: No. Participating in groups: No. Taking medication as prescribed: Yes. Toleration medication: Yes. Family/Significant other contact made: No, will contact:  SW will discuss with Pt during assessment. Patient understands diagnosis: No. Discussing patient identified problems/goals with staff: No. Medical problems stabilized or resolved: Yes. Denies suicidal/homicidal ideation: Yes. Issues/concerns per patient self-inventory: No. Other: Pt did not attend treatment team meeting.  New problem(s) identified: Yes, Describe:  Pt admitted for delusional thinking.   New Short Term/Long Term Goal(s): SW will meet with Pt to complete psychosocial assessment. Pt will be encouraged to take medications as prescribed. SW will discuss continue support services post discharge.  Patient Goals:  None *PT DID NOT ATTEND*  Discharge Plan or Barriers: SW will continue to assess. Pt is Spanish speaking and requires interpreter services.  Reason for Continuation of Hospitalization: Delusions  Medication stabilization  Estimated Length of Stay: 3-5 Days  Attendees: Patient: Did not attend 12/24/2019 10:23 AM  Physician: Dr. Myles Lipps 12/24/2019 10:23 AM  Nursing:  12/24/2019 10:23 AM  RN Care Manager: 12/24/2019 10:23 AM  Social Worker: Freddi Che, LCSW 12/24/2019 10:23 AM  Recreational Therapist:  12/24/2019 10:23 AM  Other:  12/24/2019 10:23 AM  Other:  12/24/2019 10:23 AM  Other: 12/24/2019 10:23 AM    Scribe for Treatment Team: Freddi Che, LCSW 12/24/2019 10:23 AM
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
## 2019-12-24 NOTE — BHH Counselor (Signed)
CSW attempted to complete PSA, however this patient was sleeping and was unable to be woken.   CSW will attempt to complete PSA at a later time.   Ruthann Cancer MSW, LCSW Clincal Social Worker  [facility]

## 2019-12-24 NOTE — BHH Group Notes (Addendum)
LCSW Group Therapy Notes   Type of Therapy and Topic:Group Therapy:Healthy and Unhealthy Supports  Date and Time:12/24/19 and 1:00pm  InterpretorElita Quick [phone])  Participation Level:BHH PARTICIPATION LEVEL: Minimal   Description of Group:Patients in this group were introduced to the idea of adding a variety of healthy supports to address the various needs in their lives. Patients discussed what additional healthy supports could be helpful in their recovery and wellness after discharge in order to prevent future hospitalizations. An emphasis was placed on using counselor, doctor, therapy groups, 12-step groups, and problem-specific support groups to expand supports. They also worked as a group on developing a specific plan for several patients to deal with unhealthy supports through boundary-setting, psychoeducation with loved ones, and even termination of relationships.  Therapeutic Goals:  1) discuss importance of adding supports to stay well once out of the hospital 2) compare healthy versus unhealthy supports and identify some examples of each 3) generate ideas and descriptions of healthy supports that can be added 4) offer mutual support about how to address unhealthy supports 5) encourage active participation in and adherence to discharge plan   Summary of Patient Progress:This patient attended and participated in group. This patient struggled to identify supports.   Therapeutic Modalities:  Motivational Interviewing Brief Solution-Focused Therapy   Ruthann Cancer MSW, LCSW Clincal Social Worker  [facility]

## 2019-12-24 NOTE — Progress Notes (Signed)
[facility] MD Progress Note  12/24/2019 2:52 PM Quinton Reginal Wojcicki  MRN:  [id number] Subjective: Patient is a 29 year old [place] male who was admitted on/3/21 after presenting to the emergency department at [facility] with bizarre behavior.  Objective: Patient is seen and examined.  Patient is a 29 year old [place] male who is seen in follow-up today.  He is seen with the electronic translator.  The patient was asked what led to his hospitalization.  He went into a long disorganized dialogue about witches and auditory hallucinations from his stomach in his head.  He stated that he was not hearing voices very much today except out of his stomach.  When asked about whether he took his medications after his discharge when he was hospitalized here in February 2021 he stated that instructions to be able to pick up his medicines and to get the medicines was too complicated and he denied ever having taken medications after he left the hospital.  When we discussed his amphetamine use initially he said that he had not used methamphetamines in 15 days, but when we discussed the fact that his drug screen was positive he said that he had used "a little bit" 2 to 3 days prior to admission.  When given the opportunity to ask me questions about the hospitalization he went into a long dialogue about homosexuality, having been in the [employer], having been discharged from the [employer], and having been an alien.  His current medications include Cogentin as needed, the Zyprexa agitation protocol as needed, lorazepam as needed and Risperdal 3 mg p.o. nightly.  He received the long-acting paliperidone injection 234 mg this AM.  His vital signs are stable, he is afebrile.  He slept 3.25 hours last night, but did spend a great deal of time in the bed today.  Review of his admission laboratories from 9/30 showed a mildly elevated glucose at 122, and elevated lipase at 61.  Liver function enzymes were normal.  Lipids were normal except for  triglycerides at 169.  His CBC was normal.  Acetaminophen was less than 10, salicylate was less than 7.  Hemoglobin A1c was 5.7.  Blood alcohol was less than 10.  Drug screen was positive for amphetamines.  Principal Problem: Psychoactive substance-induced psychosis (HCC) Diagnosis: Principal Problem:   Psychoactive substance-induced psychosis (HCC) Active Problems:   Schizophrenia (HCC)  Total Time spent with patient: 20 minutes  Past Psychiatric History: See admission H&P  Past Medical History: History reviewed. No pertinent past medical history. History reviewed. No pertinent surgical history. Family History: History reviewed. No pertinent family history. Family Psychiatric  History: See admission H&P Social History:  Social History   Substance and Sexual Activity  Alcohol Use Yes     Social History   Substance and Sexual Activity  Drug Use Not Currently  . Types: Methamphetamines, Marijuana    Social History   Socioeconomic History  . Marital status: Single    Spouse name: Not on file  . Number of children: Not on file  . Years of education: Not on file  . Highest education level: Not on file  Occupational History  . Not on file  Tobacco Use  . Smoking status: Current Some Day Smoker    Types: Cigarettes  . Smokeless tobacco: Never Used  Vaping Use  . Vaping Use: Never used  Substance and Sexual Activity  . Alcohol use: Yes  . Drug use: Not Currently    Types: Methamphetamines, Marijuana  . Sexual activity: Not on file  Other Topics Concern  . Not on file  Social History Narrative  . Not on file   Social Determinants of Health   Financial Resource Strain:   . Difficulty of Paying Living Expenses: Not on file  Food Insecurity:   . Worried About Programme researcher, broadcasting/film/video in the Last Year: Not on file  . Ran Out of Food in the Last Year: Not on file  Transportation Needs:   . Lack of Transportation (Medical): Not on file  . Lack of Transportation  (Non-Medical): Not on file  Physical Activity:   . Days of Exercise per Week: Not on file  . Minutes of Exercise per Session: Not on file  Stress:   . Feeling of Stress : Not on file  Social Connections:   . Frequency of Communication with Friends and Family: Not on file  . Frequency of Social Gatherings with Friends and Family: Not on file  . Attends Religious Services: Not on file  . Active Member of Clubs or Organizations: Not on file  . Attends Banker Meetings: Not on file  . Marital Status: Not on file   Additional Social History:                         Sleep: Fair  Appetite:  Good  Current Medications: Current Facility-Administered Medications  Medication Dose Route Frequency Provider Last Rate Last Admin  . acetaminophen (TYLENOL) tablet 650 mg  650 mg Oral Q4H PRN Chales Abrahams, NP      . alum & mag hydroxide-simeth (MAALOX/MYLANTA) 200-200-20 MG/5ML suspension 30 mL  30 mL Oral Q6H PRN Ophelia Shoulder E, NP      . benztropine (COGENTIN) tablet 2 mg  2 mg Oral BID PRN Mariel Craft, MD       Or  . benztropine mesylate (COGENTIN) injection 2 mg  2 mg Intramuscular BID PRN Mariel Craft, MD      . influenza vac split quadrivalent PF (FLUARIX) injection 0.5 mL  0.5 mL Intramuscular Tomorrow-1000 Mariel Craft, MD      . OLANZapine zydis [facility]) disintegrating tablet 10 mg  10 mg Oral Q8H PRN Mariel Craft, MD       And  . LORazepam (ATIVAN) tablet 1 mg  1 mg Oral PRN Mariel Craft, MD       And  . ziprasidone (GEODON) injection 20 mg  20 mg Intramuscular PRN Mariel Craft, MD      . LORazepam (ATIVAN) tablet 1 mg  1 mg Oral Q6H PRN Antonieta Pert, MD      . ondansetron [facility]) tablet 4 mg  4 mg Oral Q8H PRN Chales Abrahams, NP      . Melene Muller ON 12/27/2019] paliperidone (INVEGA SUSTENNA) injection 156 mg  156 mg Intramuscular Once Mariel Craft, MD       Followed by  . [START ON 01/24/2020] paliperidone (INVEGA SUSTENNA)  injection 234 mg  234 mg Intramuscular Q28 days Mariel Craft, MD      . pantoprazole (PROTONIX) EC tablet 40 mg  40 mg Oral BID AC Mariel Craft, MD   40 mg at 12/24/19 0715  . pneumococcal 23 valent vaccine (PNEUMOVAX-23) injection 0.5 mL  0.5 mL Intramuscular Tomorrow-1000 Mariel Craft, MD      . risperiDONE (RISPERDAL) tablet 4 mg  4 mg Oral QHS Jola Babinski Marlane Mingle, MD      . traZODone [facility])  tablet 150 mg  150 mg Oral QHS PRN Antonieta Pertlary, Susann Lawhorne Lawson, MD        Lab Results:  Results for orders placed or performed during the hospital encounter of 12/22/19 (from the past 48 hour(s))  Hemoglobin A1c     Status: Abnormal   Collection Time: 12/23/19  5:46 PM  Result Value Ref Range   Hgb A1c MFr Bld 5.7 (H) 4.8 - 5.6 %    Comment: (NOTE) Pre diabetes:          5.7%-6.4%  Diabetes:              >6.4%  Glycemic control for   <7.0% adults with diabetes    Mean Plasma Glucose 116.89 mg/dL    Comment: Performed at [facility], [address] [place], [place] [address]  Lipid panel     Status: Abnormal   Collection Time: 12/23/19  5:46 PM  Result Value Ref Range   Cholesterol 153 0 - 200 mg/dL   Triglycerides 604169 (H) <150 mg/dL   HDL 37 (L) >54>40 mg/dL   Total CHOL/HDL Ratio 4.1 RATIO   VLDL 34 0 - 40 mg/dL   LDL Cholesterol 82 0 - 99 mg/dL    Comment:        Total Cholesterol/HDL:CHD Risk Coronary Heart Disease Risk Table                     Men   Women  1/2 Average Risk   3.4   3.3  Average Risk       5.0   4.4  2 X Average Risk   9.6   7.1  3 X Average Risk  23.4   11.0        Use the calculated Patient Ratio above and the CHD Risk Table to determine the patient's CHD Risk.        ATP III CLASSIFICATION (LDL):  <100     mg/dL   Optimal  098-119100-129  mg/dL   Near or Above                    Optimal  130-159  mg/dL   Borderline  147-829160-189  mg/dL   High  >562>190     mg/dL   Very High Performed at [facility], [address] [place], [place]  [address]     Blood Alcohol level:  Lab Results  Component Value Date   ETH <10 12/20/2019   ETH <10 05/04/2019    Metabolic Disorder Labs: Lab Results  Component Value Date   HGBA1C 5.7 (H) 12/23/2019   MPG 116.89 12/23/2019   No results found for: PROLACTIN Lab Results  Component Value Date   CHOL 153 12/23/2019   TRIG 169 (H) 12/23/2019   HDL 37 (L) 12/23/2019   CHOLHDL 4.1 12/23/2019   VLDL 34 12/23/2019   LDLCALC 82 12/23/2019    Physical Findings: AIMS: Facial and Oral Movements Muscles of Facial Expression: None, normal Lips and Perioral Area: None, normal Jaw: None, normal Tongue: None, normal,Extremity Movements Upper (arms, wrists, hands, fingers): None, normal Lower (legs, knees, ankles, toes): None, normal, Trunk Movements Neck, shoulders, hips: None, normal, Overall Severity Severity of abnormal movements (highest score from questions above): None, normal Incapacitation due to abnormal movements: None, normal Patient's awareness of abnormal movements (rate only patient's report): No Awareness, Dental Status Current problems with teeth and/or dentures?: No Does patient usually wear dentures?: No  CIWA:  COWS:     Musculoskeletal: Strength & Muscle Tone: within normal limits Gait & Station: normal Patient leans: N/A  Psychiatric Specialty Exam: Physical Exam Vitals and nursing note reviewed.  Constitutional:      Appearance: Normal appearance.  HENT:     Head: Normocephalic and atraumatic.  Pulmonary:     Effort: Pulmonary effort is normal.  Neurological:     General: No focal deficit present.     Mental Status: He is alert and oriented to person, place, and time.     Review of Systems  All other systems reviewed and are negative.   Blood pressure 101/65, pulse 85, temperature 98.2 F (36.8 C), temperature source Oral, resp. rate 16, height 5\' 4"  (1.626 m), weight 73 kg, SpO2 100 %.Body mass index is 27.62 kg/m.  General Appearance:  Casual  Eye Contact:  Fair  Speech:  Normal Rate  Volume:  Normal  Mood:  Dysphoric  Affect:  Congruent  Thought Process:  Disorganized and Descriptions of Associations: Loose  Orientation:  Full (Time, Place, and Person)  Thought Content:  Delusions and Hallucinations: Auditory  Suicidal Thoughts:  No  Homicidal Thoughts:  No  Memory:  Immediate;   Poor Recent;   Poor Remote;   Poor  Judgement:  Impaired  Insight:  Lacking  Psychomotor Activity:  Normal  Concentration:  Concentration: Fair and Attention Span: Fair  Recall:  of Knowledge:  Fair  Language:  Fair  Akathisia:  Negative  Handed:  Right  AIMS (if indicated):     Assets:  Desire for Improvement Resilience  ADL's:  Intact  Cognition:  WNL  Sleep:  Number of Hours: 3.25     Treatment Plan Summary: Daily contact with patient to assess and evaluate symptoms and progress in treatment, Medication management and Plan : Patient is seen and examined.  Patient is a 29 year old 37 male with the above-stated past psychiatric history was seen in follow-up.   Diagnosis: 1.  Schizophrenia 2.  Substance-induced psychotic disorder  Pertinent findings on examination today: 1.  Continued auditory hallucinations. 2.  Continued disorganized and almost tangential thinking. 3.  Denies suicidal or homicidal ideation.  Plan: 1.  Increase Risperdal to 4 mg p.o. nightly for psychosis, mood stability and sleep 2.  Patient received the long-acting paliperidone injection 234 mg IM x1 today for psychosis. 3.  Increase trazodone to 150 mg p.o. nightly for insomnia. 4.  Obtain EKG for interpretation given antipsychotic medications. 5.  Continue Zyprexa agitation protocol as needed. 6.  Continue Protonix 40 mg p.o. twice daily for gastric protection. 7.  Patient is scheduled to receive the long-acting paliperidone injection 156 mg IM x1 on 12/27/19. 8.  Disposition planning-in progress.  02/26/20,  MD 12/24/2019, 2:52 PM

## 2019-12-24 NOTE — Progress Notes (Signed)
   12/24/19 2000  Psych Admission Type (Psych Patients Only)  Admission Status Voluntary  Psychosocial Assessment  Patient Complaints None  Eye Contact Fair  Facial Expression Anxious  Affect Appropriate to circumstance  Speech Soft  Interaction Assertive  Motor Activity Lethargic;Slow  Appearance/Hygiene In scrubs  Behavior Characteristics Cooperative  Mood Depressed  Thought Process  Coherency Circumstantial  Content Delusions  Delusions Paranoid;Persecutory  Perception Illusions  Hallucination None reported or observed;Auditory  Judgment Impaired  Confusion Moderate  Danger to Self  Current suicidal ideation? Denies  Danger to Others  Danger to Others None reported or observed

## 2019-12-25 MED ORDER — CHLORPROMAZINE HCL 25 MG PO TABS
25.0000 mg | ORAL_TABLET | Freq: Every day | ORAL | Status: DC
  Administered 2019-12-25: 25 mg via ORAL
  Filled 2019-12-25 (×4): qty 1

## 2019-12-25 MED ORDER — CHLORPROMAZINE HCL 100 MG PO TABS
100.0000 mg | ORAL_TABLET | Freq: Every day | ORAL | Status: DC
  Administered 2019-12-25: 100 mg via ORAL
  Filled 2019-12-25 (×2): qty 1

## 2019-12-25 MED ORDER — RISPERIDONE 3 MG PO TABS
3.0000 mg | ORAL_TABLET | Freq: Every day | ORAL | Status: DC
  Administered 2019-12-25: 3 mg via ORAL
  Filled 2019-12-25 (×2): qty 1

## 2019-12-25 NOTE — Progress Notes (Signed)
   12/25/19 2050  Psych Admission Type (Psych Patients Only)  Admission Status Voluntary  Psychosocial Assessment  Patient Complaints None  Eye Contact Fair  Facial Expression Anxious  Affect Sad;Anxious;Apprehensive  Speech Soft  Interaction Assertive  Motor Activity Other (Comment) (wnl)  Appearance/Hygiene Unremarkable  Behavior Characteristics Cooperative;Anxious  Mood Depressed;Anxious;Pleasant  Thought Process  Coherency Circumstantial  Content WDL  Delusions None reported or observed  Perception WDL  Hallucination None reported or observed  Judgment Impaired  Confusion None  Danger to Self  Current suicidal ideation? Denies  Danger to Others  Danger to Others None reported or observed   Pt speaks very little [place]. Interpreter machine was not working so used Health visitor on phone. Pt states that he is not currently SI, HI, AVH or pain. Pt states that the medicine has made him feel better than he was yesterday. Pt takes medications without incident.

## 2019-12-25 NOTE — Progress Notes (Signed)
Pt was complaining of pain in his stomach , pt given Tylenol, Zyprexa and Maalox per MAR due to pt stated, they causing me problems. Will continue to monitor

## 2019-12-25 NOTE — Progress Notes (Signed)
   12/25/19 1700  Psych Admission Type (Psych Patients Only)  Admission Status Voluntary  Psychosocial Assessment  Patient Complaints Anxiety  Eye Contact Fair  Facial Expression Anxious  Affect Sad;Anxious  Speech Soft  Interaction Assertive  Motor Activity Other (Comment) (WDL)  Appearance/Hygiene Unremarkable  Behavior Characteristics Cooperative  Mood Depressed;Anxious  Thought Process  Content Delusions  Delusions Paranoid  Hallucination None reported or observed  Judgment Impaired  Confusion Moderate  Danger to Self  Current suicidal ideation? Denies  Danger to Others  Danger to Others None reported or observed   Pt assessed using Spanish interpretor for assistance. Pt reports hearing the voice of his daughter's mother laughing and making fun of him saying "taking advantage of my innocence." He reports he did not sleep well last night and has slept some today. He c/o of feeling "nervous." Pt rates his day as a 9 on 1-10 scale with 10 being the best. Pt also referenced his family being abandoned. Offered support, encouragement and 15 minute checks. He denies si and hi thoughts. Safety maintained on the unit.

## 2019-12-25 NOTE — Progress Notes (Signed)
[facility] MD Progress Note  12/25/2019 1:37 PM Kevin Brown  MRN:  [id number] Subjective:  Patient is a 29 year old [place] male who was admitted on/3/21 after presenting to the emergency department at [facility] with bizarre behavior.  Objective: Patient is seen and examined.  Patient is a 29 year old [place] male who is seen in follow-up today.  He is seen with the electronic translator.  The patient stated that the auditory hallucinations from his stomach are still present.  They are less so than they have been.  They continue to tell him that he is a homosexual.  We discussed the methamphetamine issue, and he continues to contend that he is "not taking so much methamphetamines".  We discussed the fact of his 2 psychiatric admissions and I am aware of that are secondary to his psychosis from the methamphetamines.  He continues to state that he is not sleeping at night.  He received the long-acting paliperidone injection on 12/24/2019.  He had Thorazine 25 mg p.o. daily and 100 mg p.o. nightly started today.  Otherwise he remains on Risperdal 3 mg p.o. nightly.  He also receives trazodone 150 mg p.o. nightly as needed.  His blood pressure remains mildly elevated 114/99.  Pulse is 83.  He is afebrile.  Nursing notes reveal that he slept 3 hours last night, but does sleep a great deal during the day.  Nursing notes reflected that he was paranoid about sleeping in the room with another patient.  He denied any suicidal or homicidal ideation.  Auditory hallucinations and somatic delusions as previously stated.  Principal Problem: Psychoactive substance-induced psychosis (HCC) Diagnosis: Principal Problem:   Psychoactive substance-induced psychosis (HCC) Active Problems:   Schizophrenia (HCC)  Total Time spent with patient: 20 minutes  Past Psychiatric History: See admission H&P  Past Medical History: History reviewed. No pertinent past medical history. History reviewed. No pertinent surgical  history. Family History: History reviewed. No pertinent family history. Family Psychiatric  History: See admission H&P Social History:  Social History   Substance and Sexual Activity  Alcohol Use Yes     Social History   Substance and Sexual Activity  Drug Use Not Currently  . Types: Methamphetamines, Marijuana    Social History   Socioeconomic History  . Marital status: Single    Spouse name: Not on file  . Number of children: Not on file  . Years of education: Not on file  . Highest education level: Not on file  Occupational History  . Not on file  Tobacco Use  . Smoking status: Current Some Day Smoker    Types: Cigarettes  . Smokeless tobacco: Never Used  Vaping Use  . Vaping Use: Never used  Substance and Sexual Activity  . Alcohol use: Yes  . Drug use: Not Currently    Types: Methamphetamines, Marijuana  . Sexual activity: Not on file  Other Topics Concern  . Not on file  Social History Narrative  . Not on file   Social Determinants of Health   Financial Resource Strain:   . Difficulty of Paying Living Expenses: Not on file  Food Insecurity:   . Worried About Programme researcher, broadcasting/film/video in the Last Year: Not on file  . Ran Out of Food in the Last Year: Not on file  Transportation Needs:   . Lack of Transportation (Medical): Not on file  . Lack of Transportation (Non-Medical): Not on file  Physical Activity:   . Days of Exercise per Week: Not on file  .  Minutes of Exercise per Session: Not on file  Stress:   . Feeling of Stress : Not on file  Social Connections:   . Frequency of Communication with Friends and Family: Not on file  . Frequency of Social Gatherings with Friends and Family: Not on file  . Attends Religious Services: Not on file  . Active Member of Clubs or Organizations: Not on file  . Attends BankerClub or Organization Meetings: Not on file  . Marital Status: Not on file   Additional Social History:                         Sleep:  Poor  Appetite:  Good  Current Medications: Current Facility-Administered Medications  Medication Dose Route Frequency Provider Last Rate Last Admin  . acetaminophen (TYLENOL) tablet 650 mg  650 mg Oral Q4H PRN Ophelia ShoulderMills, Shnese E, NP   650 mg at 12/25/19 0128  . alum & mag hydroxide-simeth (MAALOX/MYLANTA) 200-200-20 MG/5ML suspension 30 mL  30 mL Oral Q6H PRN Ophelia ShoulderMills, Shnese E, NP   30 mL at 12/25/19 0103  . benztropine (COGENTIN) tablet 2 mg  2 mg Oral BID PRN Mariel CraftMaurer, Sheila M, MD       Or  . benztropine mesylate (COGENTIN) injection 2 mg  2 mg Intramuscular BID PRN Mariel CraftMaurer, Sheila M, MD      . chlorproMAZINE (THORAZINE) tablet 100 mg  100 mg Oral QHS Antonieta Pertlary, Devonna Oboyle Lawson, MD      . chlorproMAZINE (THORAZINE) tablet 25 mg  25 mg Oral Daily Antonieta Pertlary, Ervin Hensley Lawson, MD   25 mg at 12/25/19 1141  . influenza vac split quadrivalent PF (FLUARIX) injection 0.5 mL  0.5 mL Intramuscular Tomorrow-1000 Mariel CraftMaurer, Sheila M, MD      . OLANZapine zydis [facility](ZYPREXA) disintegrating tablet 10 mg  10 mg Oral Q8H PRN Mariel CraftMaurer, Sheila M, MD   10 mg at 12/25/19 0103   And  . LORazepam (ATIVAN) tablet 1 mg  1 mg Oral PRN Mariel CraftMaurer, Sheila M, MD       And  . ziprasidone (GEODON) injection 20 mg  20 mg Intramuscular PRN Mariel CraftMaurer, Sheila M, MD      . LORazepam (ATIVAN) tablet 1 mg  1 mg Oral Q6H PRN Antonieta Pertlary, Nabor Thomann Lawson, MD      . ondansetron [facility](ZOFRAN) tablet 4 mg  4 mg Oral Q8H PRN Chales AbrahamsMills, Shnese E, NP      . Melene Muller[START ON 12/27/2019] paliperidone (INVEGA SUSTENNA) injection 156 mg  156 mg Intramuscular Once Mariel CraftMaurer, Sheila M, MD       Followed by  . [START ON 01/24/2020] paliperidone (INVEGA SUSTENNA) injection 234 mg  234 mg Intramuscular Q28 days Mariel CraftMaurer, Sheila M, MD      . pantoprazole (PROTONIX) EC tablet 40 mg  40 mg Oral BID AC Mariel CraftMaurer, Sheila M, MD   40 mg at 12/25/19 0644  . pneumococcal 23 valent vaccine (PNEUMOVAX-23) injection 0.5 mL  0.5 mL Intramuscular Tomorrow-1000 Mariel CraftMaurer, Sheila M, MD      . risperiDONE (RISPERDAL) tablet 3 mg  3  mg Oral QHS Antonieta Pertlary, Roxi Hlavaty Lawson, MD      . traZODone (DESYREL) tablet 150 mg  150 mg Oral QHS PRN Antonieta Pertlary, Bayden Gil Lawson, MD   150 mg at 12/24/19 2049    Lab Results:  Results for orders placed or performed during the hospital encounter of 12/22/19 (from the past 48 hour(s))  Hemoglobin A1c     Status: Abnormal   Collection Time: 12/23/19  5:46 PM  Result Value Ref Range   Hgb A1c MFr Bld 5.7 (H) 4.8 - 5.6 %    Comment: (NOTE) Pre diabetes:          5.7%-6.4%  Diabetes:              >6.4%  Glycemic control for   <7.0% adults with diabetes    Mean Plasma Glucose 116.89 mg/dL    Comment: Performed at [facility], [address], [place], [place] [address]  Lipid panel     Status: Abnormal   Collection Time: 12/23/19  5:46 PM  Result Value Ref Range   Cholesterol 153 0 - 200 mg/dL   Triglycerides 941 (H) <150 mg/dL   HDL 37 (L) >74 mg/dL   Total CHOL/HDL Ratio 4.1 RATIO   VLDL 34 0 - 40 mg/dL   LDL Cholesterol 82 0 - 99 mg/dL    Comment:        Total Cholesterol/HDL:CHD Risk Coronary Heart Disease Risk Table                     Men   Women  1/2 Average Risk   3.4   3.3  Average Risk       5.0   4.4  2 X Average Risk   9.6   7.1  3 X Average Risk  23.4   11.0        Use the calculated Patient Ratio above and the CHD Risk Table to determine the patient's CHD Risk.        ATP III CLASSIFICATION (LDL):  <100     mg/dL   Optimal  081-448  mg/dL   Near or Above                    Optimal  130-159  mg/dL   Borderline  185-631  mg/dL   High  >497     mg/dL   Very High Performed at [facility], [address], [place], [place] [address]   TSH     Status: None   Collection Time: 12/24/19  6:18 PM  Result Value Ref Range   TSH 3.325 0.350 - 4.500 uIU/mL    Comment: Performed by a 3rd Generation assay with a functional sensitivity of <=0.01 uIU/mL. Performed at [facility], [address], [place], [place] [address]     Blood  Alcohol level:  Lab Results  Component Value Date   ETH <10 12/20/2019   ETH <10 05/04/2019    Metabolic Disorder Labs: Lab Results  Component Value Date   HGBA1C 5.7 (H) 12/23/2019   MPG 116.89 12/23/2019   No results found for: PROLACTIN Lab Results  Component Value Date   CHOL 153 12/23/2019   TRIG 169 (H) 12/23/2019   HDL 37 (L) 12/23/2019   CHOLHDL 4.1 12/23/2019   VLDL 34 12/23/2019   LDLCALC 82 12/23/2019    Physical Findings: AIMS: Facial and Oral Movements Muscles of Facial Expression: None, normal Lips and Perioral Area: None, normal Jaw: None, normal Tongue: None, normal,Extremity Movements Upper (arms, wrists, hands, fingers): None, normal Lower (legs, knees, ankles, toes): None, normal, Trunk Movements Neck, shoulders, hips: None, normal, Overall Severity Severity of abnormal movements (highest score from questions above): None, normal Incapacitation due to abnormal movements: None, normal Patient's awareness of abnormal movements (rate only patient's report): No Awareness, Dental Status Current problems with teeth and/or dentures?: No Does patient usually  wear dentures?: No  CIWA:    COWS:     Musculoskeletal: Strength & Muscle Tone: within normal limits Gait & Station: normal Patient leans: N/A  Psychiatric Specialty Exam: Physical Exam Vitals and nursing note reviewed.  Constitutional:      Appearance: Normal appearance.  HENT:     Head: Normocephalic and atraumatic.  Pulmonary:     Effort: Pulmonary effort is normal.  Neurological:     General: No focal deficit present.     Mental Status: He is alert.     Review of Systems  All other systems reviewed and are negative.   Blood pressure (!) 114/99, pulse 83, temperature 98.5 F (36.9 C), temperature source Oral, resp. rate 16, height 5\' 4"  (1.626 m), weight 73 kg, SpO2 100 %.Body mass index is 27.62 kg/m.  General Appearance: Fairly Groomed  Eye Contact:  Fair  Speech:  Normal Rate   Volume:  Normal  Mood:  Anxious and Dysphoric  Affect:  Congruent  Thought Process:  Goal Directed and Descriptions of Associations: Loose  Orientation:  Negative  Thought Content:  Delusions, Hallucinations: Auditory Somatic and Paranoid Ideation  Suicidal Thoughts:  No  Homicidal Thoughts:  No  Memory:  Immediate;   Fair Recent;   Fair Remote;   Fair  Judgement:  Impaired  Insight:  Lacking  Psychomotor Activity:  Normal  Concentration:  Concentration: Fair and Attention Span: Fair  Recall:  of Knowledge:  Fair  Language:  Good  Akathisia:  Negative  Handed:  Right  AIMS (if indicated):     Assets:  Desire for Improvement Resilience  ADL's:  Intact  Cognition:  WNL  Sleep:  Number of Hours: 3     Treatment Plan Summary: Daily contact with patient to assess and evaluate symptoms and progress in treatment, Medication management and Plan : Patient is seen and examined.  Patient is a 29 year old male with the above-stated past psychiatric history who is seen in follow-up.  Diagnosis: 1.  Schizophrenia. 2.  Substance-induced psychotic disorder. 3.  Methamphetamine dependence  Pertinent findings on examination today: 1.  Continued auditory and somatic hallucinations and delusions. 2.  Decreased disorganization and decreased tangential thinking. 3.  Sleep still poor. 4.  Denies suicidal or homicidal ideation.  Plan: 1.  Add Thorazine 25 mg p.o. daily and 100 mg p.o. nightly for mood stability, psychosis and sleep. 2.  Decrease Risperdal to 3 mg p.o. nightly for psychosis, mood stability and sleep. 3.  Patient received the long-acting paliperidone injection 234 mg IM x1 on 12/24/2019.  This is for psychosis. 4.  Continue trazodone 150 mg p.o. nightly for insomnia. 5.  Reorder EKG for interpretation given antipsychotic medications. 6.  Continue Zyprexa agitation protocol as needed. 7.  Continue Protonix 40 mg p.o. twice daily for gastric protection. 8.   Patient is scheduled to receive the long-acting paliperidone injection 156 mg IM x1 on 12/27/2019. 9.  Disposition planning-in progress. 02/26/2020, MD 12/25/2019, 1:37 PM

## 2019-12-25 NOTE — Progress Notes (Signed)
Pt appeared very paranoid to have a roommate in the room , pt seemed very uncomfortable about sleeping in the room.

## 2019-12-25 NOTE — Progress Notes (Signed)
Pt appears to be very paranoid, and does not appear to be in any distress at this time.

## 2019-12-26 MED ORDER — PANTOPRAZOLE SODIUM 40 MG PO TBEC
40.0000 mg | DELAYED_RELEASE_TABLET | Freq: Every day | ORAL | Status: DC
  Administered 2019-12-27: 40 mg via ORAL
  Filled 2019-12-26 (×3): qty 1

## 2019-12-26 MED ORDER — PREGABALIN 100 MG PO CAPS: 100.0000 mg | ORAL_CAPSULE | Freq: Every day | ORAL | Status: DC

## 2019-12-26 MED ORDER — PREGABALIN 75 MG PO CAPS
150.0000 mg | ORAL_CAPSULE | Freq: Every day | ORAL | Status: DC
  Administered 2019-12-26: 150 mg via ORAL
  Filled 2019-12-26: qty 2

## 2019-12-26 MED ORDER — RISPERIDONE 2 MG PO TABS
4.0000 mg | ORAL_TABLET | Freq: Every day | ORAL | Status: DC
  Administered 2019-12-26: 4 mg via ORAL
  Filled 2019-12-26 (×2): qty 2
  Filled 2019-12-26: qty 14
  Filled 2019-12-26: qty 2

## 2019-12-26 MED ORDER — PREGABALIN 50 MG PO CAPS
50.0000 mg | ORAL_CAPSULE | Freq: Every day | ORAL | Status: DC
  Administered 2019-12-26 – 2019-12-27 (×2): 50 mg via ORAL
  Filled 2019-12-26 (×2): qty 1

## 2019-12-26 NOTE — Progress Notes (Signed)
Pt awake and having trouble with his stomach. Pt given meds to sleep and for his stomach. Will continue to monitor.

## 2019-12-26 NOTE — Progress Notes (Signed)
Adult Psychoeducational Group Note  Date:  12/26/2019 Time:  2:25 AM  Group Topic/Focus:  Wrap-Up Group:   The focus of this group is to help patients review their daily goal of treatment and discuss progress on daily workbooks.  Participation Level:  Did Not Attend  Participation Quality:  Did Not Attend  Affect:  Did Not Attend  Cognitive:  Did Not Attend  Insight: None  Engagement in Group:  Did Not Attend  Modes of Intervention:  Did Not Attend  Additional Comments:  Pt did not attend evening wrap up group tonight.  Felipa Furnace 12/26/2019, 2:25 AM

## 2019-12-26 NOTE — Plan of Care (Signed)
Progress note  D: pt found in bed; compliant with medication administration. Pt denies any physical pain or complaints. Pt still has feelings of anxiety and "demons" in their abdomen, though these have improved. Pt still states from 2100 on, these feelings increase, causing insomnia. Pt feels as though they are improving, rating their day an 8/10. Pt continues to be reclusive to their room, sleeping during the day and not attending group. Pt denies si/hi/ah/vh and verbally agrees to approach staff if these become apparent or before harming themself/others while at [facility].  A: Pt provided support and encouragement. Pt given medication per protocol and standing orders. Q72m safety checks implemented and continued.  R: Pt safe on the unit. Will continue to monitor.  Pt progressing in the following metrics  Problem: Education: Goal: Knowledge of [name] Education information/materials will improve Outcome: Progressing Goal: Emotional status will improve Outcome: Progressing Goal: Mental status will improve Outcome: Progressing Goal: Verbalization of understanding the information provided will improve Outcome: Progressing   Interpreter [phone] used

## 2019-12-26 NOTE — [facility] Group Notes (Signed)
Patient did not attend orientation group.  

## 2019-12-26 NOTE — Progress Notes (Signed)
[facility] MD Progress Note  12/26/2019 12:09 PM Kevin Brown  MRN:  [id number] Subjective:  Patient is a 29 year old [place] male who was admitted on/3/21 after presenting to the emergency department at [facility] with bizarre behavior.  Objective: Patient is seen and examined.  Patient is a 29 year old [place] male who is seen today in follow-up.  He is also seen with nursing.  He is seen with the electronic translator.  Because of his continued auditory hallucinations as well as what I believe are somatic delusions I added pregabalin today.  I also added the pregabalin to help him rest better at night.  He continues to complain of problems sleeping.  He stated he was up till around 4 AM.  He stated that the voices were bad at that time.  The voices still have an origin in his stomach.  He stated today that his auditory hallucinations have decreased.  He rated his mood an 8 out of 10 with 10 being normal.  He denied any side effects to his current medications.  His current medications include the long-acting paliperidone injection.  The Thorazine which was started but was ineffective has been stopped.  He also continues on oral Risperdal.  His blood pressure is elevated this morning at 114/99.  Pulse was 83.  He is afebrile.  His last blood pressure was 106/65.  No new laboratories.  He only slept 3 hours last night.  Principal Problem: Psychoactive substance-induced psychosis (HCC) Diagnosis: Principal Problem:   Psychoactive substance-induced psychosis (HCC) Active Problems:   Schizophrenia (HCC)  Total Time spent with patient: 20 minutes  Past Psychiatric History: See admission H&P  Past Medical History: History reviewed. No pertinent past medical history. History reviewed. No pertinent surgical history. Family History: History reviewed. No pertinent family history. Family Psychiatric  History: See admission H&P Social History:  Social History   Substance and Sexual Activity   Alcohol Use Yes     Social History   Substance and Sexual Activity  Drug Use Not Currently  . Types: Methamphetamines, Marijuana    Social History   Socioeconomic History  . Marital status: Single    Spouse name: Not on file  . Number of children: Not on file  . Years of education: Not on file  . Highest education level: Not on file  Occupational History  . Not on file  Tobacco Use  . Smoking status: Current Some Day Smoker    Types: Cigarettes  . Smokeless tobacco: Never Used  Vaping Use  . Vaping Use: Never used  Substance and Sexual Activity  . Alcohol use: Yes  . Drug use: Not Currently    Types: Methamphetamines, Marijuana  . Sexual activity: Not on file  Other Topics Concern  . Not on file  Social History Narrative  . Not on file   Social Determinants of Health   Financial Resource Strain:   . Difficulty of Paying Living Expenses: Not on file  Food Insecurity:   . Worried About Programme researcher, broadcasting/film/video in the Last Year: Not on file  . Ran Out of Food in the Last Year: Not on file  Transportation Needs:   . Lack of Transportation (Medical): Not on file  . Lack of Transportation (Non-Medical): Not on file  Physical Activity:   . Days of Exercise per Week: Not on file  . Minutes of Exercise per Session: Not on file  Stress:   . Feeling of Stress : Not on file  Social Connections:   .  Frequency of Communication with Friends and Family: Not on file  . Frequency of Social Gatherings with Friends and Family: Not on file  . Attends Religious Services: Not on file  . Active Member of Clubs or Organizations: Not on file  . Attends Banker Meetings: Not on file  . Marital Status: Not on file   Additional Social History:                         Sleep: Poor  Appetite:  Fair  Current Medications: Current Facility-Administered Medications  Medication Dose Route Frequency Provider Last Rate Last Admin  . acetaminophen (TYLENOL) tablet 650  mg  650 mg Oral Q4H PRN Ophelia Shoulder E, NP   650 mg at 12/26/19 0408  . alum & mag hydroxide-simeth (MAALOX/MYLANTA) 200-200-20 MG/5ML suspension 30 mL  30 mL Oral Q6H PRN Ophelia Shoulder E, NP   30 mL at 12/25/19 0103  . benztropine (COGENTIN) tablet 2 mg  2 mg Oral BID PRN Mariel Craft, MD       Or  . benztropine mesylate (COGENTIN) injection 2 mg  2 mg Intramuscular BID PRN Mariel Craft, MD      . influenza vac split quadrivalent PF (FLUARIX) injection 0.5 mL  0.5 mL Intramuscular Tomorrow-1000 Mariel Craft, MD      . OLANZapine zydis [facility]) disintegrating tablet 10 mg  10 mg Oral Q8H PRN Mariel Craft, MD   10 mg at 12/25/19 0103   And  . LORazepam (ATIVAN) tablet 1 mg  1 mg Oral PRN Mariel Craft, MD       And  . ziprasidone (GEODON) injection 20 mg  20 mg Intramuscular PRN Mariel Craft, MD      . LORazepam (ATIVAN) tablet 1 mg  1 mg Oral Q6H PRN Antonieta Pert, MD   1 mg at 12/26/19 0036  . ondansetron (ZOFRAN) tablet 4 mg  4 mg Oral Q8H PRN Ophelia Shoulder E, NP   4 mg at 12/26/19 0037  . [START ON 12/27/2019] paliperidone (INVEGA SUSTENNA) injection 156 mg  156 mg Intramuscular Once Mariel Craft, MD       Followed by  . [START ON 01/24/2020] paliperidone (INVEGA SUSTENNA) injection 234 mg  234 mg Intramuscular Q28 days Mariel Craft, MD      . pantoprazole (PROTONIX) EC tablet 40 mg  40 mg Oral BID AC Mariel Craft, MD   40 mg at 12/25/19 1644  . pneumococcal 23 valent vaccine (PNEUMOVAX-23) injection 0.5 mL  0.5 mL Intramuscular Tomorrow-1000 Mariel Craft, MD      . pregabalin (LYRICA) capsule 100 mg  100 mg Oral QHS Antonieta Pert, MD      . pregabalin (LYRICA) capsule 50 mg  50 mg Oral Daily Antonieta Pert, MD   50 mg at 12/26/19 3295  . risperiDONE (RISPERDAL) tablet 4 mg  4 mg Oral QHS Antonieta Pert, MD      . traZODone (DESYREL) tablet 150 mg  150 mg Oral QHS PRN Antonieta Pert, MD   150 mg at 12/26/19 0036    Lab Results:   Results for orders placed or performed during the hospital encounter of 12/22/19 (from the past 48 hour(s))  TSH     Status: None   Collection Time: 12/24/19  6:18 PM  Result Value Ref Range   TSH 3.325 0.350 - 4.500 uIU/mL    Comment: Performed  by a 3rd Generation assay with a functional sensitivity of <=0.01 uIU/mL. Performed at [facility], [address] [place], [place] [address]     Blood Alcohol level:  Lab Results  Component Value Date   ETH <10 12/20/2019   ETH <10 05/04/2019    Metabolic Disorder Labs: Lab Results  Component Value Date   HGBA1C 5.7 (H) 12/23/2019   MPG 116.89 12/23/2019   No results found for: PROLACTIN Lab Results  Component Value Date   CHOL 153 12/23/2019   TRIG 169 (H) 12/23/2019   HDL 37 (L) 12/23/2019   CHOLHDL 4.1 12/23/2019   VLDL 34 12/23/2019   LDLCALC 82 12/23/2019    Physical Findings: AIMS: Facial and Oral Movements Muscles of Facial Expression: None, normal Lips and Perioral Area: None, normal Jaw: None, normal Tongue: None, normal,Extremity Movements Upper (arms, wrists, hands, fingers): None, normal Lower (legs, knees, ankles, toes): None, normal, Trunk Movements Neck, shoulders, hips: None, normal, Overall Severity Severity of abnormal movements (highest score from questions above): None, normal Incapacitation due to abnormal movements: None, normal Patient's awareness of abnormal movements (rate only patient's report): No Awareness, Dental Status Current problems with teeth and/or dentures?: No Does patient usually wear dentures?: No  CIWA:    COWS:     Musculoskeletal: Strength & Muscle Tone: within normal limits Gait & Station: normal Patient leans: N/A  Psychiatric Specialty Exam: Physical Exam Vitals and nursing note reviewed.  Constitutional:      Appearance: Normal appearance.  HENT:     Head: Normocephalic and atraumatic.  Pulmonary:     Effort: Pulmonary effort is normal.   Neurological:     General: No focal deficit present.     Mental Status: He is alert.     Review of Systems  Gastrointestinal: Positive for abdominal pain.    Blood pressure (!) 114/99, pulse 83, temperature 98.5 F (36.9 C), temperature source Oral, resp. rate 16, height 5\' 4"  (1.626 m), weight 73 kg, SpO2 100 %.Body mass index is 27.62 kg/m.  General Appearance: Casual  Eye Contact:  Fair  Speech:  Normal Rate  Volume:  Normal  Mood:  Euthymic  Affect:  Congruent  Thought Process:  Coherent and Descriptions of Associations: Loose  Orientation:  Full (Time, Place, and Person)  Thought Content:  Delusions and Hallucinations: Auditory  Suicidal Thoughts:  No  Homicidal Thoughts:  No  Memory:  Immediate;   Fair Recent;   Fair Remote;   Fair  Judgement:  Intact  Insight:  Fair  Psychomotor Activity:  Normal  Concentration:  Concentration: Good and Attention Span: Good  Recall:  Fair  Fund of Knowledge:  Good  Language:  Good  Akathisia:  Negative  Handed:  Right  AIMS (if indicated):     Assets:  Desire for Improvement Housing Resilience  ADL's:  Intact  Cognition:  WNL  Sleep:  Number of Hours: 3     Treatment Plan Summary: Daily contact with patient to assess and evaluate symptoms and progress in treatment, Medication management and Plan : Patient is seen and examined.  Patient is a 29 year old male with the above-stated past psychiatric history who is seen in follow-up.   Diagnosis: 1.  Schizophrenia. 2.  Substance-induced psychotic disorder. 3.  Methamphetamine dependence  Pertinent findings on examination today: 1.  Decreased auditory hallucinations, somatic delusions still appear to be present.  There are decreased as well. 2.  Decreased disorganization.  Not showing any real tangential thinking today. 3.  Sleep is still poor. 4.  Denies suicidal or homicidal ideation. 5.  He rates his mood as an 8 or 9 out of 10.  Plan: 1.  Stop Thorazine secondary to  lack of efficacy. 2.  Pregabalin started at 50 mg p.o. daily, but will start nightly dosage 150 mg p.o. nightly for anxiety, sleep and agitation. 3.  Increase Risperdal back to 4 mg p.o. nightly for psychosis and sleep. 4.  Continue trazodone 150 mg p.o. nightly as needed insomnia. 5.  Patient received the long-acting paliperidone injection and his next dose is due tomorrow at 156 mg.  This is for psychosis and mood stability. 6.  Decrease Protonix back to 40 mg p.o. daily for gastric protection. 7.  Disposition planning-in progress.  Antonieta Pert, MD 12/26/2019, 12:09 PM

## 2019-12-26 NOTE — Progress Notes (Signed)
Pt up again c/o problems with his stomach. Pt told to lie down and try to get some rest. Will continue to monitor.

## 2019-12-26 NOTE — Progress Notes (Signed)
Pt again up and asking for medication. Pt says his stomach hurts. Pt given Tylenol 650 mg.

## 2019-12-26 NOTE — Progress Notes (Signed)
   12/26/19 2049  Psych Admission Type (Psych Patients Only)  Admission Status Voluntary  Psychosocial Assessment  Patient Complaints Anxiety;Worrying  Eye Contact Fair  Facial Expression Anxious;Pensive;Worried  Affect Anxious;Preoccupied  Naval architect other than Metallurgist  Appearance/Hygiene In scrubs  Behavior Characteristics Cooperative;Anxious;Appropriate to situation  Mood Anxious;Preoccupied  Thought Process  Coherency Circumstantial  Content Delusions;Obsessions  Delusions Somatic  Perception Hallucinations  Hallucination Auditory (hearing the witches laughing at him)  Judgment Impaired  Confusion None  Danger to Self  Current suicidal ideation? Denies  Danger to Others  Danger to Others None reported or observed   Pt seen at med window. Used translator app on iPhone to assess pt. Pt denies SI, HI and pain. Pt endorses AH of witches laughing at him and trying to put something inside of him. Pt takes medication appropriately.

## 2019-12-26 NOTE — [facility] Group Notes (Signed)
[facility] LCSW Group Therapy  12/26/2019 2:30 PM  Type of Therapy:  Discharge Planning  Participation Level:  Did Not Attend  Participation Quality:  Did not attend  Affect:  Did not attend  Cognitive:  Did not attend  Insight:  Did not attend  Engagement in Therapy:  Did not attend  Modes of Intervention:  Did not attend  Summary of Progress/Problems: This patient was invited to attend group by CSW, however declined attendance.  Titania Gault A Amylah Will 12/26/2019, 2:30 PM  

## 2019-12-27 MED ORDER — PANTOPRAZOLE SODIUM 40 MG PO TBEC
40.0000 mg | DELAYED_RELEASE_TABLET | Freq: Every day | ORAL | 0 refills | Status: DC
Start: 2019-12-28 — End: 2020-04-09

## 2019-12-27 MED ORDER — PREGABALIN 150 MG PO CAPS
150.0000 mg | ORAL_CAPSULE | Freq: Every day | ORAL | 0 refills | Status: DC
Start: 2019-12-27 — End: 2020-04-09

## 2019-12-27 MED ORDER — RISPERIDONE 4 MG PO TABS
4.0000 mg | ORAL_TABLET | Freq: Every day | ORAL | 0 refills | Status: DC
Start: 2019-12-27 — End: 2020-04-09

## 2019-12-27 MED ORDER — PALIPERIDONE PALMITATE ER 234 MG/1.5ML IM SUSY
234.0000 mg | PREFILLED_SYRINGE | INTRAMUSCULAR | 0 refills | Status: DC
Start: 2020-01-24 — End: 2020-04-09

## 2019-12-27 MED ORDER — PALIPERIDONE PALMITATE ER 156 MG/ML IM SUSY
156.0000 mg | PREFILLED_SYRINGE | Freq: Once | INTRAMUSCULAR | Status: AC
  Administered 2019-12-27: 156 mg via INTRAMUSCULAR
  Filled 2019-12-27: qty 1

## 2019-12-27 MED ORDER — PALIPERIDONE PALMITATE ER 234 MG/1.5ML IM SUSY: 234.0000 mg | PREFILLED_SYRINGE | INTRAMUSCULAR | 0 refills | Status: DC

## 2019-12-27 MED ORDER — PALIPERIDONE PALMITATE ER 234 MG/1.5ML IM SUSY: 234.0000 mg | PREFILLED_SYRINGE | INTRAMUSCULAR | Status: DC

## 2019-12-27 NOTE — Discharge Summary (Signed)
Physician Discharge Summary Note  Patient:  Kevin Brown is an 29 y.o., male  MRN:  [id number]  DOB:  [birth year]  Patient phone:  [phone] (home)   Patient address:   [address] [place] [id number],   Total Time spent with patient: 1 hour  Date of Admission:  12/22/2019  Date of Discharge: 12/27/19  Reason for Admission: Bizarre Behaviors & delusional thinking.  Principal Problem: Psychoactive substance-induced psychosis [facility](HCC)  Discharge Diagnoses: Principal Problem:   Psychoactive substance-induced psychosis (HCC) Active Problems:   Schizophrenia [facility](HCC)  Past Psychiatric History: See admission H&P  Past Medical History: History reviewed. No pertinent past medical history. History reviewed. No pertinent surgical history.  Family History: History reviewed. No pertinent family history.  Family Psychiatric  History: See admission H&P  Social History:  Social History   Substance and Sexual Activity  Alcohol Use Yes     Social History   Substance and Sexual Activity  Drug Use Not Currently  . Types: Methamphetamines, Marijuana    Social History   Socioeconomic History  . Marital status: Single    Spouse name: Not on file  . Number of children: Not on file  . Years of education: Not on file  . Highest education level: Not on file  Occupational History  . Not on file  Tobacco Use  . Smoking status: Current Some Day Smoker    Types: Cigarettes  . Smokeless tobacco: Never Used  Vaping Use  . Vaping Use: Never used  Substance and Sexual Activity  . Alcohol use: Yes  . Drug use: Not Currently    Types: Methamphetamines, Marijuana  . Sexual activity: Not on file  Other Topics Concern  . Not on file  Social History Narrative  . Not on file   Social Determinants of Health   Financial Resource Strain:   . Difficulty of Paying Living Expenses: Not on file  Food Insecurity:   . Worried About Programme researcher, broadcasting/film/videounning Out of Food in the Last Year: Not  on file  . Ran Out of Food in the Last Year: Not on file  Transportation Needs:   . Lack of Transportation (Medical): Not on file  . Lack of Transportation (Non-Medical): Not on file  Physical Activity:   . Days of Exercise per Week: Not on file  . Minutes of Exercise per Session: Not on file  Stress:   . Feeling of Stress : Not on file  Social Connections:   . Frequency of Communication with Friends and Family: Not on file  . Frequency of Social Gatherings with Friends and Family: Not on file  . Attends Religious Services: Not on file  . Active Member of Clubs or Organizations: Not on file  . Attends BankerClub or Organization Meetings: Not on file  . Marital Status: Not on file   Hospital Course: (Per Md's admission evaluation notes): Kevin Brown is a 29 y.o. [name] with a history of substance induced psychosis who presented to the emergency department on 12/20/2019 with bizarre behaviors.  Initial psychiatric evaluation from that encounter: [facility] stating that witches are putting objects into his body. He feels that the witches are targeting his bladder, kidneys, nose and intestines. The witches started doing this to him x 1 year ago. Patient most recently started to hear voices from those various body parts as mentioned. He calls the witches by names, "Kelby FamManuel" and "[name]". Kelby FamManuel is the witch that does sexual things to him. [name]  is a witch that cast evil spirits on him and tries to kill him. There are also witches that he refers to as "[name]" and "[name]". Patient repeats that he has been bewitched several times this year. He further explains that the witches are tracking him down and feels like he is living in a haunted house. He feels that the witches realize he is in a safe environment so this is making them angry. He repeats, "They are putting water on me" and "The witches are making me cold with the water".  As interpreter  services continued to provide services it was mentioned that patient was very repetitive. Patient was asked about suicidal ideations and denied. He denied a prior history of suicidal gestures and/or attempts. No history of self mutilating behaviors. Patient denies that he is depressed. He does reports symptoms of anxiety. Appetite is poor as he believes witches are poisoning him. His sleep routine is poor a he believes witches will try to attack him at night. No HI presently. However, has experienced past thoughts of wanting to kill the witches. He thought about killing the witches just 2 days ago Denies history of aggressive and assaultive behaviors. Denies legal issues. He has a history of methamphetamine, alcohol, and THC use. Last use of all substances was 12/20/19.  Patient received Zyprexa in the emergency department prior to this assessment. On evaluation, patient is calm and cooperative with some bizarre paranoid behaviors.  He has difficulty making eye contact and appears to be responding to internal stimuli.  He complains of stomach planes and heartburn, but denies which is being present.  Patient describes that [facility] he took the medication that was prescribed to him for that month, and did not have any auditory hallucinations or concerns about which is.  However since that time, he has not continued medication.  He did not make his follow-up appointment.  He today endorses that he has only been smoking marijuana, and does not know how his urine drug screen could be positive for amphetamines.  He does admit to using amphetamines in the past, but reports it has been more than 1 week ago.  Patient states that he lives alone and has been working many jobs.  He is interested in restarting the medications that were prescribed at his last hospitalization.  Discussed long-acting injectable antipsychotic for which he is agreeable.  Patient requests  medicine to help with his heartburn symptoms.  He denies any suicidal or homicidal ideation.   Later in the day room, he expresses his concerns about the which is in his stomach to nursing staff.  This isthesecondadmission/discharge summaries for this56year old [place] male from this BHhospital. He was known in this hospital from his previous hospitalization for drug induced psychosis requiring stabilization.Kevin Brown hasaprevious hx of mental illness &substance abuse issues.He was admitted this time around with complaints of bizarre behaviors & delusional thinking. He was recommended evaluation & mood stabilization treatments.  After evaluation of hispresenting symptoms, it was jointly agreed by the treatment team torecommend Ramirofor further mood stabilization treatments. The medication regimentargeting those presenting symptomswere discussed &initiatedwith hisconsent. Hewas stabilized on themedicationsas listed below on his discharge medication lists. He was also enrolled & participated in the group counseling sessions being offered & held on this unit.He learned coping skills.He presented other significant medical issues that required treatment  & or monitoring.He was resumed & discharged on all thepertinent home medications for those health issues.  He tolerated histreatment regimen without any adverse effects or reactions reported.   And because of the chronic nature of his psychiatric symptoms & their resistance to the treatment regimen in the past, Ladarrell has been treated, stabilized & dischargedPearly two separate antipsychotic medications (Invega injectable & Risperdal tabs) this time around. This is because he has not been able to achieve symptoms control under an antipsychotic monotherapy. However, on this present admission, his symptoms responded well on  These two separate antipsychotic medication warranting this discharge.  Najib's symptoms responded well to  histreatment regimen. This is evidenced by hisreports of improved mood, resolution of symptoms &presentation of good affect/eye contact.Heiscurrently mentally & medically stable for dischargeto continue mental health careas recommended below.   Today upon his discharge evaluationwith the attending psychiatrist today,Ramiroshares, "I'm doing good. I feel much better".He denies any specific concerns.He is sleeping well. Hisappetite is good. He denies other physical complaints.He denies SI/HI/AH/VH.He is tolerating hismedications well &in agreement to continue hiscurrent regimen as noted on the discharge medication lists below.He will follow up for routine psychiatric care, further substance abuse treatment&medication management as noted below. He is provided with all the necessary information needed to make these appointments without problems. He was able to engage in safety planning including plan to return to [facility] or contact emergency services if he feels unable to maintain hisown safety or the safety of others. Pt had no further questions, comments or concerns. He left [facility] with all personal belongings in no apparent distress.Transportation per the safe transport services.  Physical Findings: AIMS: Facial and Oral Movements Muscles of Facial Expression: None, normal Lips and Perioral Area: None, normal Jaw: None, normal Tongue: None, normal,Extremity Movements Upper (arms, wrists, hands, fingers): None, normal Lower (legs, knees, ankles, toes): None, normal, Trunk Movements Neck, shoulders, hips: None, normal, Overall Severity Severity of abnormal movements (highest score from questions above): None, normal Incapacitation due to abnormal movements: None, normal Patient's awareness of abnormal movements (rate only patient's report): No Awareness, Dental Status Current problems with teeth and/or dentures?: No Does patient usually wear dentures?: No  CIWA:    COWS:      Musculoskeletal: Strength & Muscle Tone: within normal limits Gait & Station: normal Patient leans: N/A  Psychiatric Specialty Exam: Physical Exam Vitals and nursing note reviewed.  Constitutional:      Appearance: He is well-developed.  HENT:     Head: Normocephalic.     Nose: Nose normal.     Mouth/Throat:     Pharynx: Oropharynx is clear.  Eyes:     Pupils: Pupils are equal, round, and reactive to light.  Cardiovascular:     Rate and Rhythm: Normal rate.  Pulmonary:     Effort: Pulmonary effort is normal.  Genitourinary:    Comments: Deferred Musculoskeletal:        General: Normal range of motion.     Cervical back: Normal range of motion.  Skin:    General: Skin is warm and dry.  Neurological:     General: No focal deficit present.     Mental Status: He is alert and oriented to person, place, and time. Mental status is at baseline.     Review of Systems  Constitutional: Negative.  Negative for chills, diaphoresis and fever.  HENT: Negative for congestion, rhinorrhea, sneezing and sore throat.   Eyes: Negative for discharge.  Respiratory: Negative for cough, chest tightness, shortness of breath and wheezing.   Cardiovascular: Negative for chest pain and palpitations.  Gastrointestinal: Negative  for diarrhea, nausea and vomiting.  Endocrine: Negative for cold intolerance.  Genitourinary: Negative for difficulty urinating.  Musculoskeletal: Negative for arthralgias and myalgias.  Skin: Negative.   Allergic/Immunologic: Negative for environmental allergies and food allergies.       Allergies: NKDA  Neurological: Negative for dizziness, tremors, seizures, syncope, facial asymmetry, speech difficulty, weakness, light-headedness, numbness and headaches.  Psychiatric/Behavioral: Positive for dysphoric mood (Stabilized with medication to discharge), hallucinations (Hx. Psychosis (stabilized with medication prior to discharge) and sleep disturbance (Stabilized with  medication prior to discharge). Negative for agitation, behavioral problems, confusion, decreased concentration, self-injury and suicidal ideas. The patient is not nervous/anxious (Stable upon discharge) and is not hyperactive.     Blood pressure 118/70, pulse (!) 110, temperature 97.9 F (36.6 C), temperature source Oral, resp. rate 16, height 5\' 4"  (1.626 m), weight 73 kg, SpO2 100 %.Body mass index is 27.62 kg/m.  See MD's discharge SRA   Have you used any form of tobacco in the last 30 days? (Cigarettes, Smokeless Tobacco, Cigars, and/or Pipes): Yes  Has this patient used any form of tobacco in the last 30 days? (Cigarettes, Smokeless Tobacco, Cigars, and/or Pipes)  No  Blood Alcohol level:  Lab Results  Component Value Date   ETH <10 12/20/2019   ETH <10 05/04/2019    Metabolic Disorder Labs:  Lab Results  Component Value Date   HGBA1C 5.7 (H) 12/23/2019   MPG 116.89 12/23/2019   No results found for: PROLACTIN Lab Results  Component Value Date   CHOL 153 12/23/2019   TRIG 169 (H) 12/23/2019   HDL 37 (L) 12/23/2019   CHOLHDL 4.1 12/23/2019   VLDL 34 12/23/2019   LDLCALC 82 12/23/2019   See Psychiatric Specialty Exam and Suicide Risk Assessment completed by Attending Physician prior to discharge.  Discharge destination:  Home  Is patient on multiple antipsychotic therapies at discharge:  Yes,   Do you recommend tapering to monotherapy for antipsychotics?  Yes   Has Patient had three or more failed trials of antipsychotic monotherapy by history:  No  Recommended Plan for Multiple Antipsychotic Therapies: Additional reason(s) for multiple antispychotic treatment:  Hx. of treatment-resistant psychosis with long-term stability on two antipsychotic regimen.  Discharge Instructions    Diet - low sodium heart healthy   Complete by: As directed    Increase activity slowly   Complete by: As directed    Increase activity slowly   Complete by: As directed      Allergies  as of 12/27/2019   No Known Allergies     Medication List    STOP taking these medications   temazepam 15 MG capsule Commonly known as: RESTORIL     TAKE these medications     Indication  paliperidone 234 MG/1.5ML Susy injection Commonly known as: INVEGA SUSTENNA Inject 234 mg into the muscle every 28 (twenty-eight) days. Start taking on: January 24, 2020  Indication: Schizophrenia, due 01/21/20   paliperidone 234 MG/1.5ML Susy injection Commonly known as: INVEGA SUSTENNA Inject 234 mg into the muscle every 28 (twenty-eight) days. (Due on 01-24-20): For mood control Start taking on: January 24, 2020  Indication: Mood control   pantoprazole 40 MG tablet Commonly known as: PROTONIX Take 1 tablet (40 mg total) by mouth daily. Start taking on: December 28, 2019  Indication: Heartburn   pregabalin 150 MG capsule Commonly known as: LYRICA Take 1 capsule (150 mg total) by mouth at bedtime.  Indication: Generalized Anxiety Disorder   risperidone 4 MG tablet Commonly  known as: RISPERDAL Take 1 tablet (4 mg total) by mouth at bedtime. What changed:   medication strength  how much to take  when to take this  Indication: Schizophrenia       Follow-up Information    [facility]. Go on 01/04/2020.   Specialty: Behavioral Health Why: You have a walk in appointment on 01/04/20 at 12:30 pm for therapy.  You also have an appointment for medication management on 01/24/20 at 8:00 am.  These appointments will be held in person.  Contact information: [address] [place] [address] [phone]             Follow-up recommendations: Activity:  As tolerated Diet: As recommended by your primary care doctor. Keep all scheduled follow-up appointments as recommended.  Comments: Prescriptions given at discharge.  Patient agreeable to plan.  Given opportunity to ask questions.  Appears to feel comfortable with discharge denies any current  suicidal or homicidal thought. Patient is also instructed prior to discharge to: Take all medications as prescribed by his/her mental healthcare provider. Report any adverse effects and or reactions from the medicines to his/her outpatient provider promptly. Patient has been instructed & cautioned: To not engage in alcohol and or illegal drug use while on prescription medicines. In the event of worsening symptoms, patient is instructed to call the crisis hotline, 911 and or go to the nearest ED for appropriate evaluation and treatment of symptoms. To follow-up with his/her primary care provider for your other medical issues, concerns and or health care needs.   Signed: Armandina Stammer, NP, PMHNP, FNP-BC 12/27/2019, 1:51 PM

## 2019-12-27 NOTE — BHH Suicide Risk Assessment (Signed)
[facility] Discharge Suicide Risk Assessment   Principal Problem: Psychoactive substance-induced psychosis (HCC) Discharge Diagnoses: Principal Problem:   Psychoactive substance-induced psychosis (HCC) Active Problems:   Schizophrenia (HCC)   Total Time spent with patient: 30 minutes  Musculoskeletal: Strength & Muscle Tone: within normal limits Gait & Station: normal Patient leans: N/A  Psychiatric Specialty Exam: Review of Systems  Gastrointestinal: Positive for abdominal pain.  All other systems reviewed and are negative.   Blood pressure 118/70, pulse (!) 110, temperature 97.9 F (36.6 C), temperature source Oral, resp. rate 16, height 5\' 4"  (1.626 m), weight 73 kg, SpO2 100 %.Body mass index is 27.62 kg/m.  General Appearance: Casual  Eye Contact::  Fair  Speech:  Normal Rate409  Volume:  Normal  Mood:  Euthymic  Affect:  Congruent  Thought Process:  Coherent and Descriptions of Associations: Intact  Orientation:  Full (Time, Place, and Person)  Thought Content:  Delusions  Suicidal Thoughts:  No  Homicidal Thoughts:  No  Memory:  Immediate;   Fair Recent;   Fair Remote;   Fair  Judgement:  Intact  Insight:  Lacking  Psychomotor Activity:  Normal  Concentration:  Fair  Recall:  [phone] of Knowledge:Fair  Language: Good  Akathisia:  Negative  Handed:  Right  AIMS (if indicated):     Assets:  Desire for Improvement Resilience  Sleep:  Number of Hours: 8.5  Cognition: WNL  ADL's:  Intact   Mental Status Per Nursing Assessment::   On Admission:  Plan to harm others ("the witches in my stomach are telling me to hurt others")  Demographic Factors:  Male, Low socioeconomic status and Living alone  Loss Factors: NA  Historical Factors: Impulsivity  Risk Reduction Factors:   NA  Continued Clinical Symptoms:  Alcohol/Substance Abuse/Dependencies Schizophrenia:   Less than 9 years old Paranoid or undifferentiated type  Cognitive Features That  Contribute To Risk:  Thought constriction (tunnel vision)    Suicide Risk:  Minimal: No identifiable suicidal ideation.  Patients presenting with no risk factors but with morbid ruminations; may be classified as minimal risk based on the severity of the depressive symptoms   Follow-up Information    [facility]. Go on 01/04/2020.   Specialty: Behavioral Health Why: You have a walk in appointment on 01/04/20 at 12:30 pm for therapy.  You also have an appointment for medication management on 01/24/20 at 8:00 am.  These appointments will be held in person.  Contact information: [address] [place] [phone]              Plan Of Care/Follow-up recommendations:  Activity:  ad lib  [phone], MD 12/27/2019, 1:10 PM

## 2019-12-27 NOTE — Progress Notes (Signed)
Pt discharged to lobby. Pt was stable and appreciative at that time. All papers, samples and prescriptions were given and valuables returned. Verbal understanding expressed. Denies SI/HI and A/VH. Pt given opportunity to express concerns and ask questions via interpreter services.

## 2019-12-27 NOTE — Progress Notes (Signed)
Recreation Therapy Notes  Date: 10.7.21 Time: 1000 Location: 500 Hall Dayroom  Group Topic: Wellness  Goal Area(s) Addresses:  Patient will define components of whole wellness. Patient will verbalize benefit of whole wellness.  Intervention: Music  Activity: Exercise.  LRT led patients in a series of stretches.  Patients then took turns leading group in exercises of their choosing.  Patients could take breaks and get water as needed.  Education: Wellness, Discharge Planning.   Education Outcome: Acknowledges education/In group clarification offered/Needs additional education.   Clinical Observations/Feedback: Pt did not attend group session.     Ellesse Antenucci, LRT/CTRS         Emerald Gehres A 12/27/2019 10:40 AM 

## 2019-12-27 NOTE — [facility] Group Notes (Signed)
Patient did not attend orientation group.  

## 2019-12-27 NOTE — Progress Notes (Signed)
  [facility] Adult Case Management Discharge Plan :  Will you be returning to the same living situation after discharge:  Yes,  to apartment with friends. At discharge, do you have transportation home?: No. Safe Transport will be arranged. Do you have the ability to pay for your medications: No. Samples to be provided at discharge.  Release of information consent forms completed and in the chart;  Patient's signature needed at discharge.  Patient to Follow up at:  Follow-up Information    [facility]. Go on 01/04/2020.   Specialty: Behavioral Health Why: You have a walk in appointment on 01/04/20 at 12:30 pm for therapy.  You also have an appointment for medication management on 01/24/20 at 8:00 am.  These appointments will be held in person.  Contact information: [address] [place] [address] [phone]              Next level of care provider has access to [facility] Link:yes  Safety Planning and Suicide Prevention discussed: Yes,  with patient'  Have you used any form of tobacco in the last 30 days? (Cigarettes, Smokeless Tobacco, Cigars, and/or Pipes): Yes  Has patient been referred to the Quitline?: Patient refused referral  Patient has been referred for addiction treatment: Pt. refused referral  Otelia Santee, LCSW 12/27/2019, 10:11 AM

## 2020-03-28 ENCOUNTER — Emergency Department (HOSPITAL_COMMUNITY)
Admission: EM | Admit: 2020-03-28 | Discharge: 2020-04-04 | Disposition: A | Discharge status: Home / Self Care | Payer: Self-pay | Attending: Emergency Medicine | Admitting: Emergency Medicine

## 2020-03-28 ENCOUNTER — Encounter (HOSPITAL_COMMUNITY): Payer: Self-pay | Admitting: Emergency Medicine

## 2020-03-28 DIAGNOSIS — F19251 Other psychoactive substance dependence with psychoactive substance-induced psychotic disorder with hallucinations: Secondary | ICD-10-CM | POA: Insufficient documentation

## 2020-03-28 DIAGNOSIS — F1590 Other stimulant use, unspecified, uncomplicated: Secondary | ICD-10-CM | POA: Diagnosis present

## 2020-03-28 DIAGNOSIS — U071 COVID-19: Secondary | ICD-10-CM | POA: Insufficient documentation

## 2020-03-28 DIAGNOSIS — F19959 Other psychoactive substance use, unspecified with psychoactive substance-induced psychotic disorder, unspecified: Secondary | ICD-10-CM | POA: Diagnosis present

## 2020-03-28 DIAGNOSIS — F29 Unspecified psychosis not due to a substance or known physiological condition: Secondary | ICD-10-CM | POA: Insufficient documentation

## 2020-03-28 DIAGNOSIS — F209 Schizophrenia, unspecified: Secondary | ICD-10-CM | POA: Diagnosis present

## 2020-03-28 DIAGNOSIS — F17210 Nicotine dependence, cigarettes, uncomplicated: Secondary | ICD-10-CM | POA: Insufficient documentation

## 2020-03-28 DIAGNOSIS — F1510 Other stimulant abuse, uncomplicated: Secondary | ICD-10-CM | POA: Insufficient documentation

## 2020-03-28 DIAGNOSIS — F1290 Cannabis use, unspecified, uncomplicated: Secondary | ICD-10-CM

## 2020-03-28 LAB — CBC
HCT: 47.2 % (ref 39.0–52.0)
Hemoglobin: 15.9 g/dL (ref 13.0–17.0)
MCH: 30.1 pg (ref 26.0–34.0)
MCHC: 33.7 g/dL (ref 30.0–36.0)
MCV: 89.4 fL (ref 80.0–100.0)
Platelets: 234 10*3/uL (ref 150–400)
RBC: 5.28 MIL/uL (ref 4.22–5.81)
RDW: 12 % (ref 11.5–15.5)
WBC: 6.1 10*3/uL (ref 4.0–10.5)
nRBC: 0 % (ref 0.0–0.2)

## 2020-03-28 LAB — COMPREHENSIVE METABOLIC PANEL
ALT: 19 U/L (ref 0–44)
AST: 20 U/L (ref 15–41)
Albumin: 4.1 g/dL (ref 3.5–5.0)
Alkaline Phosphatase: 100 U/L (ref 38–126)
Anion gap: 12 (ref 5–15)
BUN: 7 mg/dL (ref 6–20)
CO2: 21 mmol/L — ABNORMAL LOW (ref 22–32)
Calcium: 8.7 mg/dL — ABNORMAL LOW (ref 8.9–10.3)
Chloride: 104 mmol/L (ref 98–111)
Creatinine, Ser: 0.81 mg/dL (ref 0.61–1.24)
GFR, Estimated: >60 mL/min (ref >60)
Glucose, Bld: 93 mg/dL (ref 70–99)
Potassium: 3.4 mmol/L — ABNORMAL LOW (ref 3.5–5.1)
Sodium: 137 mmol/L (ref 135–145)
Total Bilirubin: 0.3 mg/dL (ref 0.3–1.2)
Total Protein: 7.3 g/dL (ref 6.5–8.1)

## 2020-03-28 LAB — SALICYLATE LEVEL: Salicylate Lvl: <7 mg/dL — ABNORMAL LOW (ref 7.0–30.0)

## 2020-03-28 LAB — RAPID URINE DRUG SCREEN, HOSP PERFORMED
Amphetamines: POSITIVE — AB
Barbiturates: NOT DETECTED
Benzodiazepines: NOT DETECTED
Cocaine: NOT DETECTED
Opiates: NOT DETECTED
Tetrahydrocannabinol: POSITIVE — AB

## 2020-03-28 LAB — RESP PANEL BY RT-PCR (FLU A&B, COVID) ARPGX2
Influenza A by PCR: NEGATIVE
Influenza B by PCR: NEGATIVE
SARS Coronavirus 2 by RT PCR: POSITIVE — AB

## 2020-03-28 LAB — ETHANOL: Alcohol, Ethyl (B): 27 mg/dL — ABNORMAL HIGH (ref <10)

## 2020-03-28 LAB — ACETAMINOPHEN LEVEL: Acetaminophen (Tylenol), Serum: <10 ug/mL — ABNORMAL LOW (ref 10–30)

## 2020-03-28 MED ORDER — OLANZAPINE 10 MG PO TABS
10.0000 mg | ORAL_TABLET | Freq: Two times a day (BID) | ORAL | Status: DC
  Administered 2020-03-28 – 2020-04-04 (×14): 10 mg via ORAL
  Filled 2020-03-28 (×14): qty 1

## 2020-03-28 NOTE — BH Assessment (Signed)
Pt unable to be assessed at this time.  Language interpreter machine down for planned maintenance according to [employer].  Pt to be assessed when interpreter technology is working.  Pt primary language is Spanish.

## 2020-03-28 NOTE — ED Notes (Addendum)
Took TTS computer and interpreter ipad to pt room. Evaluation in session.

## 2020-03-28 NOTE — ED Notes (Signed)
CRITICAL VALUE ALERT  Critical Value:  COVID +  Date & Time Notied:  03/29/2019 0717  Provider Notified: Freida Busman MD  Orders Received/Actions taken: MD aware

## 2020-03-28 NOTE — ED Provider Notes (Signed)
[facility]-EMERGENCY DEPT [facility] Emergency Department Provider Note MRN:  [id number]  Arrival date & time: 03/28/20     Chief Complaint   Psychiatric Evaluation   History of Present Illness   Kevin Brown is a 30 y.o. year-old male with a history of schizophrenia presenting to the ED with chief complaint of psychiatric evaluation.  Patient is hearing voices, he feels the voices crawl into his brain like a snake.  He feels that he may be possessed by a witch or serpent.  Feels like his genitals are speaking to him.  He is hearing hallucinations, telling him "bad things".  Review of Systems  A complete 10 system review of systems was obtained and all systems are negative except as noted in the HPI and PMH.   Patient's Health History   History reviewed. No pertinent past medical history.  History reviewed. No pertinent surgical history.  History reviewed. No pertinent family history.  Social History   Socioeconomic History  . Marital status: Single    Spouse name: Not on file  . Number of children: Not on file  . Years of education: Not on file  . Highest education level: Not on file  Occupational History  . Not on file  Tobacco Use  . Smoking status: Current Some Day Smoker    Types: Cigarettes  . Smokeless tobacco: Never Used  Vaping Use  . Vaping Use: Never used  Substance and Sexual Activity  . Alcohol use: Yes  . Drug use: Not Currently    Types: Methamphetamines, Marijuana  . Sexual activity: Not on file  Other Topics Concern  . Not on file  Social History Narrative  . Not on file   Social Determinants of Health   Financial Resource Strain: Not on file  Food Insecurity: Not on file  Transportation Needs: Not on file  Physical Activity: Not on file  Stress: Not on file  Social Connections: Not on file  Intimate Partner Violence: Not on file     Physical Exam   Vitals:   03/28/20 0202  BP: (!) 141/96  Pulse: 85  Resp: 14  Temp: 98.1 F  (36.7 C)  SpO2: 98%    CONSTITUTIONAL: Well-appearing, NAD NEURO:  Alert and oriented x 3, no focal deficits EYES:  eyes equal and reactive ENT/NECK:  no LAD, no JVD CARDIO: Regular rate, well-perfused, normal S1 and S2 PULM:  CTAB no wheezing or rhonchi GI/GU:  normal bowel sounds, non-distended, non-tender MSK/SPINE:  No gross deformities, no edema SKIN:  no rash, atraumatic PSYCH: Bizarre, delusional speech and behavior  *Additional and/or pertinent findings included in MDM below  Diagnostic and Interventional Summary    EKG Interpretation  Date/Time:    Ventricular Rate:    PR Interval:    QRS Duration:   QT Interval:    QTC Calculation:   R Axis:     Text Interpretation:        Labs Reviewed  COMPREHENSIVE METABOLIC PANEL - Abnormal; Notable for the following components:      Result Value   Potassium 3.4 (*)    CO2 21 (*)    Calcium 8.7 (*)    All other components within normal limits  ETHANOL - Abnormal; Notable for the following components:   Alcohol, Ethyl (B) 27 (*)    All other components within normal limits  SALICYLATE LEVEL - Abnormal; Notable for the following components:   Salicylate Lvl <7.0 (*)    All other components within normal limits  ACETAMINOPHEN LEVEL - Abnormal; Notable for the following components:   Acetaminophen (Tylenol), Serum <10 (*)    All other components within normal limits  RAPID URINE DRUG SCREEN, HOSP PERFORMED - Abnormal; Notable for the following components:   Amphetamines POSITIVE (*)    Tetrahydrocannabinol POSITIVE (*)    All other components within normal limits  RESP PANEL BY RT-PCR (FLU A&B, COVID) ARPGX2  CBC    No orders to display    Medications - No data to display   Procedures  /  Critical Care Procedures  ED Course and Medical Decision Making  I have reviewed the triage vital signs, the nursing notes, and pertinent available records from the EMR.  Listed above are laboratory and imaging tests that I  personally ordered, reviewed, and interpreted and then considered in my medical decision making (see below for details).  Concern for acute psychosis, IVC paperwork initiated, will need medical clearance with labs and TTS evaluation.     Patient is medically cleared, awaiting TTS recommendations.  Signed out to default provider.  Elmer Sow. Pilar Plate, MD [facility] Emergency Medicine [facility] [email or web address]  Final Clinical Impressions(s) / ED Diagnoses     ICD-10-CM   1. Psychosis, unspecified psychosis type (HCC)  F29     ED Discharge Orders    None       Discharge Instructions Discussed with and Provided to Patient:   Discharge Instructions   None       Sabas Sous, MD 03/28/20 [id number]

## 2020-03-28 NOTE — Consult Note (Signed)
Patient assessed by TTS counselor, reports auditory hallucinations as well as delusional thought content.  Patient endorses methamphetamine use. Patient medical record reviewed, discussed with Dr. Nelly Rout.  Will recommend inpatient psychiatric treatment for now, patient currently Covid positive. Medical record and routine labs reviewed.  QTC 431. Will initiate medication, Zyprexa 10 mg twice daily to address mood/hallucinations. Patient will be reassessed by psychiatry tomorrow.
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
## 2020-03-28 NOTE — ED Notes (Signed)
Pt moved from [place] C to Triage Rm 8 d/t covid (+) status at this time.

## 2020-03-28 NOTE — ED Triage Notes (Signed)
Pt arriving via EMS from home for psych eval. Pt reports that he is hearing voices in his genitals. Also reports that the voices in his genitals are making his penis and feet numb. Pt thinks he takes Seroquel but is not sure. Denies any SI/HI at this time. Pt ambulatory upon arrival.

## 2020-03-28 NOTE — ED Notes (Signed)
TTS consult postponed due to interpreter service not working RN Lillia Abed and Berna Spare with [facility] aware

## 2020-03-28 NOTE — ED Notes (Signed)
Isolation has been in place, but not previously documented.

## 2020-03-28 NOTE — ED Notes (Signed)
Pt sleeping. Woke up for vitals and fell alseep.

## 2020-03-29 DIAGNOSIS — F1590 Other stimulant use, unspecified, uncomplicated: Secondary | ICD-10-CM | POA: Diagnosis present

## 2020-03-29 DIAGNOSIS — F1290 Cannabis use, unspecified, uncomplicated: Secondary | ICD-10-CM

## 2020-03-29 DIAGNOSIS — F1510 Other stimulant abuse, uncomplicated: Secondary | ICD-10-CM | POA: Diagnosis present

## 2020-03-29 NOTE — ED Provider Notes (Signed)
Emergency Medicine Observation Re-evaluation Note  Kevin Brown is a 30 y.o. male, seen on rounds today.  Pt initially presented to the ED for complaints of Psychiatric Evaluation and IVC Currently, the patient is resting comfortably.  Physical Exam  BP 119/79 (BP Location: Left Arm)   Pulse 82   Temp 98.3 F (36.8 C) (Oral)   Resp 16   SpO2 98%  Physical Exam General: No acute distress  Psych: Denies SI HI  ED Course / MDM  EKG:EKG Interpretation  Date/Time:  Friday March 28 2020 11:10:36 EST Ventricular Rate:  97 PR Interval:    QRS Duration: 84 QT Interval:  339 QTC Calculation: 431 R Axis:   102 Text Interpretation: Sinus rhythm Borderline right axis deviation When compared with ECG of 12/24/2019, No significant change was found Confirmed by Dione Booze ([id number]) on 03/29/2020 4:26:35 AM    I have reviewed the labs performed to date as well as medications administered while in observation.  Recent changes in the last 24 hours include no new changes .  Plan  Current plan is for reassessment by psychiatry . Patient is under full IVC at this time.   Lorre Nick, MD 03/29/20 [id number]

## 2020-03-29 NOTE — ED Notes (Signed)
Pt asked to have his head checked because he is hearing voices.

## 2020-03-29 NOTE — Consult Note (Addendum)
Telepsych Consultation   Reason for Consult:  Psych Consult Referring Physician:  Kennis CarinaMichael Bero, MD Location of Patient: [facility] Location of Provider: Other: [facility]  Patient Identification: Kevin Brown MRN:  [id number] Principal Diagnosis: Schizophrenia [facility](HCC) Diagnosis:  Principal Problem:   Schizophrenia (HCC) Active Problems:   Substance-induced psychotic disorder (HCC)   Psychoactive substance-induced psychosis (HCC)   Amphetamine user (HCC)   Marijuana use  Total Time spent with patient: 15 minutes  Subjective:   Kevin Brown is a 30 y.o. male patient with a history of schizophrenia admitted for psychiatric evaluation for auditory and tactile hallucinations. UDS + for amphetamines, THC.   This assessment was conducted using [employer]pre[employer] services. Interpreter Clemmie Krillmelda [phone]#[id number]  Patient presents calm lying in bed. "I still hear those voices they wont let me in and they are like witchcraft. I cannot be okay with that. They are always haunting me, the greater witch whenever they do that witchcraft that always happens to me.  The voices are telling me at anytime now I will become homosexual and I will die soon; it's been going on 2 or 3 weeks. They are tormenting me, they don't let me think clearly". Denies any illicit drug use.   Patient denies suicidal/homicidal ideations. Continues to endorse delusonal thought content, extensive paranoia with command auditory and tactile hallucinations. Patient does not appear to be responding to any external/internal stimuli at this time.   HPI:   Kevin SaxonRamiro Juarez Kevin Brown is a 30 year old male patient with a history of schizophrenia admitted for psychiatric evaluation for hallucinations and possible psychosis. Patient states he lives in [place] with a friend. Patient last seen in ED 12/20/2019 for SI, hallucinations and [facility], 05/03/19 for substance induced psychotic disorder. No current outpatient services noted.    Past Psychiatric History:  Schizophrenia Substance induced psychotic disorder Amphetamine use Marijuana use Alcohol intoxication  Risk to Self:  patient denies Risk to Others:  patient denies Prior Inpatient Therapy:  yes Prior Outpatient Therapy:  yes  Past Medical History: History reviewed. No pertinent past medical history. History reviewed. No pertinent surgical history. Family History: History reviewed. No pertinent family history. Family Psychiatric  History: not noted Social History:  Social History   Substance and Sexual Activity  Alcohol Use Yes     Social History   Substance and Sexual Activity  Drug Use Not Currently   Types: Methamphetamines, Marijuana    Social History   Socioeconomic History   Marital status: Single    Spouse name: Not on file   Number of children: Not on file   Years of education: Not on file   Highest education level: Not on file  Occupational History   Not on file  Tobacco Use   Smoking status: Current Some Day Smoker    Types: Cigarettes   Smokeless tobacco: Never Used  Vaping Use   Vaping Use: Never used  Substance and Sexual Activity   Alcohol use: Yes   Drug use: Not Currently    Types: Methamphetamines, Marijuana   Sexual activity: Not on file  Other Topics Concern   Not on file  Social History Narrative   Not on file   Social Determinants of Health   Financial Resource Strain: Not on file  Food Insecurity: Not on file  Transportation Needs: Not on file  Physical Activity: Not on file  Stress: Not on file  Social Connections: Not on file   Additional Social History:   Allergies:  No Known Allergies  Labs:  Results for orders placed or performed during the hospital encounter of 03/28/20 (from the past 48 hour(s))  Comprehensive metabolic panel     Status: Abnormal   Collection Time: 03/28/20  2:18 AM  Result Value Ref Range   Sodium 137 135 - 145 mmol/L   Potassium 3.4 (L) 3.5 - 5.1 mmol/L    Chloride 104 98 - 111 mmol/L   CO2 21 (L) 22 - 32 mmol/L   Glucose, Bld 93 70 - 99 mg/dL    Comment: Glucose reference range applies only to samples taken after fasting for at least 8 hours.   BUN 7 6 - 20 mg/dL   Creatinine, Ser 8.85 0.61 - 1.24 mg/dL   Calcium 8.7 (L) 8.9 - 10.3 mg/dL   Total Protein 7.3 6.5 - 8.1 g/dL   Albumin 4.1 3.5 - 5.0 g/dL   AST 20 15 - 41 U/L   ALT 19 0 - 44 U/L   Alkaline Phosphatase 100 38 - 126 U/L   Total Bilirubin 0.3 0.3 - 1.2 mg/dL   GFR, Estimated >02 >77 mL/min    Comment: (NOTE) Calculated using the CKD-EPI Creatinine Equation (2021)    Anion gap 12 5 - 15    Comment: Performed at [facility], [address] [place], [place] [address]  Ethanol     Status: Abnormal   Collection Time: 03/28/20  2:18 AM  Result Value Ref Range   Alcohol, Ethyl (B) 27 (H) <10 mg/dL    Comment: (NOTE) Lowest detectable limit for serum alcohol is 10 mg/dL.  For medical purposes only. Performed at [facility], [address] [place], [place] [address]   Salicylate level     Status: Abnormal   Collection Time: 03/28/20  2:18 AM  Result Value Ref Range   Salicylate Lvl <7.0 (L) 7.0 - 30.0 mg/dL    Comment: Performed at [facility], [address], [place], [place] [address]  Acetaminophen level     Status: Abnormal   Collection Time: 03/28/20  2:18 AM  Result Value Ref Range   Acetaminophen (Tylenol), Serum <10 (L) 10 - 30 ug/mL    Comment: (NOTE) Therapeutic concentrations vary significantly. A range of 10-30 ug/mL  may be an effective concentration for many patients. However, some  are best treated at concentrations outside of this range. Acetaminophen concentrations >150 ug/mL at 4 hours after ingestion  and >50 ug/mL at 12 hours after ingestion are often associated with  toxic reactions.  Performed at [facility], [address], [place], [place] [address]   cbc      Status: None   Collection Time: 03/28/20  2:18 AM  Result Value Ref Range   WBC 6.1 4.0 - 10.5 K/uL   RBC 5.28 4.22 - 5.81 MIL/uL   Hemoglobin 15.9 13.0 - 17.0 g/dL   HCT 36.6 29.4 - 76.5 %   MCV 89.4 80.0 - 100.0 fL   MCH 30.1 26.0 - 34.0 pg   MCHC 33.7 30.0 - 36.0 g/dL   RDW 46.5 03.5 - 46.5 %   Platelets 234 150 - 400 K/uL   nRBC 0.0 0.0 - 0.2 %    Comment: Performed at [facility], [address] [place], [place] [address]  Rapid urine drug screen (hospital performed)     Status: Abnormal   Collection Time: 03/28/20  2:18 AM  Result Value Ref Range   Opiates NONE  DETECTED NONE DETECTED   Cocaine NONE DETECTED NONE DETECTED   Benzodiazepines NONE DETECTED NONE DETECTED   Amphetamines POSITIVE (A) NONE DETECTED   Tetrahydrocannabinol POSITIVE (A) NONE DETECTED   Barbiturates NONE DETECTED NONE DETECTED    Comment: (NOTE) DRUG SCREEN FOR MEDICAL PURPOSES ONLY.  IF CONFIRMATION IS NEEDED FOR ANY PURPOSE, NOTIFY LAB WITHIN 5 DAYS.  LOWEST DETECTABLE LIMITS FOR URINE DRUG SCREEN Drug Class                     Cutoff (ng/mL) Amphetamine and metabolites    1000 Barbiturate and metabolites    200 Benzodiazepine                 200 Tricyclics and metabolites     300 Opiates and metabolites        300 Cocaine and metabolites        300 THC                            50 Performed at [facility], [address] [place], [place] [address]   Resp Panel by RT-PCR (Flu A&B, Covid) Nasopharyngeal Swab     Status: Abnormal   Collection Time: 03/28/20  5:41 AM   Specimen: Nasopharyngeal Swab; Nasopharyngeal(NP) swabs in vial transport medium  Result Value Ref Range   SARS Coronavirus 2 by RT PCR POSITIVE (A) NEGATIVE    Comment: RESULT CALLED TO, READ BACK BY AND VERIFIED WITH: THOMPSON,C. RN @0715  ON 1.7.2021 BY NMCCOY (NOTE) SARS-CoV-2 target nucleic acids are DETECTED.  The SARS-CoV-2 RNA is generally detectable in upper  respiratory specimens during the acute phase of infection. Positive results are indicative of the presence of the identified virus, but do not rule out bacterial infection or co-infection with other pathogens not detected by the test. Clinical correlation with patient history and other diagnostic information is necessary to determine patient infection status. The expected result is Negative.  Fact Sheet for Patients: 3.7.2021  Fact Sheet for Healthcare Providers: BloggerCourse.com  This test is not yet approved or cleared by the SeriousBroker.it FDA and  has been authorized for detection and/or diagnosis of SARS-CoV-2 by FDA under an Emergency Use Authorization (EUA).  This EUA will remain in effect (meaning this te st can be used) for the duration of  the COVID-19 declaration under Section 564(b)(1) of the Act, 21 U.S.C. section 360bbb-3(b)(1), unless the authorization is terminated or revoked sooner.     Influenza A by PCR NEGATIVE NEGATIVE   Influenza B by PCR NEGATIVE NEGATIVE    Comment: (NOTE) The Xpert Xpress SARS-CoV-2/FLU/RSV plus assay is intended as an aid in the diagnosis of influenza from Nasopharyngeal swab specimens and should not be used as a sole basis for treatment. Nasal washings and aspirates are unacceptable for Xpert Xpress SARS-CoV-2/FLU/RSV testing.  Fact Sheet for Patients: [place]  Fact Sheet for Healthcare Providers: BloggerCourse.com  This test is not yet approved or cleared by the SeriousBroker.it FDA and has been authorized for detection and/or diagnosis of SARS-CoV-2 by FDA under an Emergency Use Authorization (EUA). This EUA will remain in effect (meaning this test can be used) for the duration of the COVID-19 declaration under Section 564(b)(1) of the Act, 21 U.S.C. section 360bbb-3(b)(1), unless the authorization is terminated  or revoked.  Performed at [facility], [address] [place], [place]     Medications:  Current Facility-Administered Medications  Medication Dose Route Frequency Provider Last Rate Last Admin   OLANZapine (ZYPREXA) tablet 10 mg  10 mg Oral BID Patrcia Dolly, FNP   10 mg at 03/29/20 1226   Current Outpatient Medications  Medication Sig Dispense Refill   paliperidone (INVEGA SUSTENNA) 234 MG/1.5ML SUSY injection Inject 234 mg into the muscle every 28 (twenty-eight) days. 1.8 mL 0   paliperidone (INVEGA SUSTENNA) 234 MG/1.5ML SUSY injection Inject 234 mg into the muscle every 28 (twenty-eight) days. (Due on 01-24-20): For mood control 1.8 mL 0   pantoprazole (PROTONIX) 40 MG tablet Take 1 tablet (40 mg total) by mouth daily. 30 tablet 0   pregabalin (LYRICA) 150 MG capsule Take 1 capsule (150 mg total) by mouth at bedtime. 30 capsule 0   risperiDONE (RISPERDAL) 4 MG tablet Take 1 tablet (4 mg total) by mouth at bedtime. 30 tablet 0   Musculoskeletal: Strength & Muscle Tone: within normal limits Gait & Station: normal Patient leans: Backward  Psychiatric Specialty Exam: Physical Exam Vitals and nursing note reviewed.  Psychiatric:        Attention and Perception: He perceives auditory hallucinations.        Speech: Speech is tangential.        Thought Content: Thought content is paranoid and delusional.        Cognition and Memory: Memory is impaired.        Judgment: Judgment is inappropriate.     Review of Systems  Psychiatric/Behavioral: Positive for hallucinations.    Blood pressure 122/87, pulse 80, temperature 98.3 F (36.8 C), temperature source Oral, resp. rate 16, SpO2 99 %.There is no height or weight on file to calculate BMI.  General Appearance: Casual  Eye Contact:  Fair  Speech:  Clear and Coherent and spanish speaking; interpreter services used.  Volume:  Normal  Mood:  Euthymic  Affect:  Restricted  Thought Process:   Disorganized  Orientation:  Other:  person  Thought Content:  Delusions, Hallucinations: Auditory Tactile, Paranoid Ideation and Rumination  Suicidal Thoughts:  No  Homicidal Thoughts:  No  Memory:  Immediate;   Poor  Judgement:  Poor  Insight:  Shallow  Psychomotor Activity:  Normal  Concentration:  Concentration: Fair and Attention Span: Fair  Recall:  [employer] of Knowledge:  Fair  Language:  Fair  Akathisia:  NA  Handed:  Left  AIMS (if indicated):     Assets:  Physical Health Resilience  ADL's:  Intact  Cognition:  WNL  Sleep:      Treatment Plan Summary: Daily contact with patient to assess and evaluate symptoms and progress in treatment, Medication management and Plan admit to adult psychiatric inpatient unit.  Disposition: Recommend psychiatric Inpatient admission when medically cleared. Patient continues to present delusional with psychotic symptoms. Medications started in ED; will continue to monitor patient response.   This service was provided via telemedicine using a 2-way, interactive audio and video technology.  Names of all persons participating in this telemedicine service and their role in this encounter. Name: Maxie Barb Role: PMHNP  Name: Nelly Rout Role: Attending MD  Name: Kevin Brown Role: patient  Name: Clemmie Krill #[id number] Role: interpreter    Loletta Parish, NP 03/29/2020 12:49 PM

## 2020-03-30 MED ORDER — ACETAMINOPHEN 325 MG PO TABS
650.0000 mg | ORAL_TABLET | Freq: Once | ORAL | Status: AC
  Administered 2020-03-30: 650 mg via ORAL
  Filled 2020-03-30: qty 2

## 2020-03-30 NOTE — BH Assessment (Signed)
Attempted reassessment.  Several calls to Triage unit, no answer.

## 2020-03-30 NOTE — ED Provider Notes (Signed)
Still endorsing active hallucinations.  Patient still awaiting psychiatric placement.    Lorre Nick, MD 03/30/20 [phone]

## 2020-03-31 ENCOUNTER — Other Ambulatory Visit: Payer: Self-pay

## 2020-03-31 ENCOUNTER — Encounter (HOSPITAL_COMMUNITY): Payer: Self-pay | Admitting: Registered Nurse

## 2020-03-31 DIAGNOSIS — F19959 Other psychoactive substance use, unspecified with psychoactive substance-induced psychotic disorder, unspecified: Secondary | ICD-10-CM

## 2020-03-31 DIAGNOSIS — F1290 Cannabis use, unspecified, uncomplicated: Secondary | ICD-10-CM

## 2020-03-31 DIAGNOSIS — F1510 Other stimulant abuse, uncomplicated: Secondary | ICD-10-CM

## 2020-03-31 LAB — POC SARS CORONAVIRUS 2 AG -  ED: SARS Coronavirus 2 Ag: NEGATIVE

## 2020-03-31 MED ORDER — ACETAMINOPHEN 325 MG PO TABS
650.0000 mg | ORAL_TABLET | Freq: Once | ORAL | Status: DC
  Filled 2020-03-31: qty 2

## 2020-03-31 MED ORDER — ONDANSETRON 8 MG PO TBDP
8.0000 mg | ORAL_TABLET | Freq: Once | ORAL | Status: AC
  Administered 2020-03-31: 8 mg via ORAL
  Filled 2020-03-31: qty 1

## 2020-03-31 MED ORDER — ACETAMINOPHEN 500 MG PO TABS
1000.0000 mg | ORAL_TABLET | Freq: Once | ORAL | Status: AC
  Administered 2020-03-31: 1000 mg via ORAL

## 2020-03-31 NOTE — Consult Note (Addendum)
Telepsych Consultation   Reason for Consult:  Psych Consult Referring Physician:  Kennis Carina, MD Location of Patient: [facility] Location of Provider: Other: [facility]  Patient Identification: Kevin Brown MRN:  [id number] Principal Diagnosis: Schizophrenia [facility]) Diagnosis:  Principal Problem:   Schizophrenia (HCC) Active Problems:   Substance-induced psychotic disorder (HCC)   Psychoactive substance-induced psychosis (HCC)   Amphetamine user (HCC)   Marijuana use  HPI:   Kevin Brown is a 30 year old male patient with a history of schizophrenia admitted for psychiatric evaluation for hallucinations and possible psychosis. Patient states he lives in [place] with a friend. Patient last seen in ED 12/20/2019 for SI, hallucinations and admitted to [facility] 12/22/19, 05/03/19 for substance induced psychotic disorder. No current outpatient services noted.    Total Time spent with patient: 15 minutes  This assessment was conducted using [employer]. Interpreter #[id number] on 03/31/2020  Kevin Brown, 30 y.o., male patient seen via tele psych by this provider, consulted with Dr. Lucianne Muss; and chart reviewed on 03/31/20.  On evaluation Kevin Brown reports he is hearing voices.  States that he is also feeling a snake in his stomach that is making him sick.  Patient reports that he was encouraged by which related to a pack that he made with a man that diet.  States the impact was he would become homosexual but he never did.  The man that took a picture of him and took it with him and they will the which will not let him rest.  Reports did not which poisoned him and injected a snake.  When patient informed of being COVID positive, patient stated he does not believe is true and that it is just part of a trick being placed on him by the witch.  Patient also asked about illicit drug use which he did not.  Patient informed of UDS being positive for amphetamines and  marijuana.  Patient admits to marijuana use but denies meth use.  Patient informed that he was started on medication that should help with the auditory and tactile hallucinations. During evaluation Kevin Brown is sitting up in bed in no acute distress.  He is alert, oriented x 4, calm and cooperative.  His mood is anxious and paranoid.  He continues to hear voices that he describes as horrible but does not express if there are commanding.  Patient also continues to have delusional thoughts that he has been cursed by which and a snake has been injected into him.  Patient denies suicidal/self-harm/homicidal ideation.  Patient continues to meet inpatient psychiatric treatment   Past Psychiatric History:  Schizophrenia Substance induced psychotic disorder Amphetamine use Marijuana use Alcohol intoxication  Risk to Self:  patient denies Risk to Others:  patient denies Prior Inpatient Therapy:  yes Prior Outpatient Therapy:  yes  Past Medical History: History reviewed. No pertinent past medical history. History reviewed. No pertinent surgical history. Family History: History reviewed. No pertinent family history. Family Psychiatric  History: not noted Social History:  Social History   Substance and Sexual Activity  Alcohol Use Yes     Social History   Substance and Sexual Activity  Drug Use Not Currently  . Types: Methamphetamines, Marijuana    Social History   Socioeconomic History  . Marital status: Single    Spouse name: Not on file  . Number of children: Not on file  . Years of education: Not on file  . Highest education level: Not on file  Occupational History  . Not on file  Tobacco Use  . Smoking status: Current Some Day Smoker    Types: Cigarettes  . Smokeless tobacco: Never Used  Vaping Use  . Vaping Use: Never used  Substance and Sexual Activity  . Alcohol use: Yes  . Drug use: Not Currently    Types: Methamphetamines, Marijuana  . Sexual activity: Not  on file  Other Topics Concern  . Not on file  Social History Narrative  . Not on file   Social Determinants of Health   Financial Resource Strain: Not on file  Food Insecurity: Not on file  Transportation Needs: Not on file  Physical Activity: Not on file  Stress: Not on file  Social Connections: Not on file   Additional Social History:   Allergies:  No Known Allergies  Labs:  No results found for this or any previous visit (from the past 48 hour(s)).  Medications:  Current Facility-Administered Medications  Medication Dose Route Frequency Provider Last Rate Last Admin  . OLANZapine (ZYPREXA) tablet 10 mg  10 mg Oral BID Patrcia Dolly, FNP   10 mg at 03/30/20 2130   Current Outpatient Medications  Medication Sig Dispense Refill  . paliperidone (INVEGA SUSTENNA) 234 MG/1.5ML SUSY injection Inject 234 mg into the muscle every 28 (twenty-eight) days. 1.8 mL 0  . paliperidone (INVEGA SUSTENNA) 234 MG/1.5ML SUSY injection Inject 234 mg into the muscle every 28 (twenty-eight) days. (Due on 01-24-20): For mood control 1.8 mL 0  . pantoprazole (PROTONIX) 40 MG tablet Take 1 tablet (40 mg total) by mouth daily. 30 tablet 0  . pregabalin (LYRICA) 150 MG capsule Take 1 capsule (150 mg total) by mouth at bedtime. 30 capsule 0  . risperiDONE (RISPERDAL) 4 MG tablet Take 1 tablet (4 mg total) by mouth at bedtime. 30 tablet 0   Musculoskeletal: Strength & Muscle Tone: within normal limits Gait & Station: normal Patient leans: Backward  Psychiatric Specialty Exam: Physical Exam Vitals and nursing note reviewed.  Constitutional:      Appearance: Normal appearance. He is normal weight.  HENT:     Head: Normocephalic.  Cardiovascular:     Rate and Rhythm: Normal rate.  Musculoskeletal:        General: Normal range of motion.  Neurological:     Mental Status: He is alert.  Psychiatric:        Attention and Perception: He perceives auditory hallucinations.        Mood and Affect:  Mood normal.        Speech: Speech is tangential.        Thought Content: Thought content is paranoid and delusional.        Cognition and Memory: Memory is impaired.        Judgment: Judgment is inappropriate.     Review of Systems  Constitutional: Negative.   Eyes: Negative.   Respiratory: Negative for apnea and shortness of breath.   Cardiovascular: Negative.   Gastrointestinal: Negative.   Genitourinary:       Complaints of stomach pain   Musculoskeletal: Negative.   Skin: Negative.   Neurological: Negative.   Hematological: Negative.   Psychiatric/Behavioral: Positive for hallucinations.    Blood pressure (!) 131/94, pulse 67, temperature 98.2 F (36.8 C), temperature source Oral, resp. rate 18, SpO2 100 %.There is no height or weight on file to calculate BMI.  General Appearance: Casual  Eye Contact:  Good  Speech:  Clear and Coherent and spanish speaking; interpreter  services used.  Volume:  Normal  Mood:  Euthymic  Affect:  Anxious  Thought Process:  Disorganized and Descriptions of Associations: Circumstantial  Orientation:  Other:  person  Thought Content:  Delusions, Hallucinations: Auditory Tactile, Paranoid Ideation and Rumination  Suicidal Thoughts:  No  Homicidal Thoughts:  No  Memory:  Immediate;   Poor  Judgement:  Poor  Insight:  Lacking  Psychomotor Activity:  Normal  Concentration:  Concentration: Fair and Attention Span: Fair  Recall:  [employer] of Knowledge:  Fair  Language:  Fair  Akathisia:  NA  Handed:  Left  AIMS (if indicated):     Assets:  Physical Health Resilience  ADL's:  Intact  Cognition:  WNL  Sleep:      Patient continues to present with delusional and psychotic symptoms.  We will continue to look for inpatient psychiatric bed availability  Treatment Plan Summary: Daily contact with patient to assess and evaluate symptoms and progress in treatment, Medication management and Plan admit to adult psychiatric inpatient  unit.  Medication management: Continue Zyprexa 10 mg twice daily  Disposition: Recommend psychiatric Inpatient admission when medically cleared.   This service was provided via telemedicine using a 2-way, interactive audio and video technology.  Names of all persons participating in this telemedicine service and their role in this encounter. Name: Assunta Found Role: NP  Name: Nelly Rout Role: Psychiatrist  Name: Kevin Brown Role: Patient  Name:  Dr. Effie Shy Role: Secure message sent informing of patient disposition    Jaiyden Laur, NP 03/31/2020 10:55 AM

## 2020-03-31 NOTE — ED Provider Notes (Signed)
Emergency Medicine Observation Re-evaluation Note  Kevin Brown is a 30 y.o. male, seen on rounds today.  Pt initially presented to the ED for complaints of Psychiatric Evaluation and IVC Currently, the patient is cooperative.  Physical Exam  BP (!) 131/94 (BP Location: Left Arm)   Pulse 67   Temp 98.2 F (36.8 C) (Oral)   Resp 18   SpO2 100%    ED Course / MDM  EKG:EKG Interpretation  Date/Time:  Friday March 28 2020 11:10:36 EST Ventricular Rate:  97 PR Interval:    QRS Duration: 84 QT Interval:  339 QTC Calculation: 431 R Axis:   102 Text Interpretation: Sinus rhythm Borderline right axis deviation When compared with ECG of 12/24/2019, No significant change was found Confirmed by Dione Booze ([id number]) on 03/29/2020 4:26:35 AM    I have reviewed the labs performed to date as well as medications administered while in observation.  Recent changes in the last 24 hours include ongoing efforts for placement.  COVID testing positive with PCR, 3 days ago.  Plan  Current plan is for psychiatric placement.  Check COVID antigen testing.  May be able to be placed if it is negative. Patient is under full IVC at this time.   Mancel Bale, MD 03/31/20 [phone]

## 2020-03-31 NOTE — BH Assessment (Addendum)
[facility] Assessment Progress Note  Per Shuvon Rankin, NP, this pt continues to require psychiatric hospitalization, but he cannot be placed at this time due to being Covid +.  Pt presents under IVC initiated by EDP Kennis Carina, MD.  [facility].  Behavioral health will round on him regularly in the ED in an effort to stabilize him.  EDP Mancel Bale, MD and pt's nurse, Waldo Laine, have been notified.   Doylene Canning, [place] Behavioral Health Coordinator [phone]

## 2020-04-01 LAB — POC SARS CORONAVIRUS 2 AG -  ED: SARS Coronavirus 2 Ag: NEGATIVE

## 2020-04-01 NOTE — BH Assessment (Signed)
Clinician followed up  to see if patient was ready for re-assessment. Per nursing interpreter services are set up but will have to locate the machine. Clinician requesting nursing to inform TTS when patient is ready for re-assessment.

## 2020-04-01 NOTE — BH Assessment (Signed)
Patient's nurse and charge nurse both made aware that patient has requested a translator. Nursing asked to notify TTS when translation services have been set up for patient in order to completed his TTS re-assessment.

## 2020-04-01 NOTE — BH Assessment (Signed)
TTS requested machine to be placed in patient's room for re-assessment.

## 2020-04-01 NOTE — ED Provider Notes (Addendum)
Emergency Medicine Observation Re-evaluation Note  Kevin Brown is a 30 y.o. male, seen on rounds today.  Pt initially presented to the ED for complaints of Psychiatric Evaluation and IVC Currently, the patient is cooperative.  Physical Exam  BP 122/65 (BP Location: Left Arm)   Pulse 84   Temp 98.3 F (36.8 C) (Oral)   Resp 18   SpO2 100%  Physical Exam General: Calm and cooperative.  Cardiac: Well perfused.  Lungs: Even, unlabored respirations.  Psych: Calm and cooperative.   ED Course / MDM  EKG:EKG Interpretation  Date/Time:  Friday March 28 2020 11:10:36 EST Ventricular Rate:  97 PR Interval:    QRS Duration: 84 QT Interval:  339 QTC Calculation: 431 R Axis:   102 Text Interpretation: Sinus rhythm Borderline right axis deviation When compared with ECG of 12/24/2019, No significant change was found Confirmed by Dione Booze ([id number]) on 03/29/2020 4:26:35 AM    I have reviewed the labs performed to date as well as medications administered while in observation.  Recent changes in the last 24 hours include TTS re-evaluation. Patient continues to meet inpatient criteria.  Plan  Current plan is for placement. Patient is COVID +.  Negative antigen tests x 2. Patient stable for psych admit.  Patient is under full IVC at this time.   Maia Plan, MD 04/01/20 1610    Maia Plan, MD 04/01/20 [phone]

## 2020-04-01 NOTE — ED Notes (Signed)
Snack and soda provided for patient.

## 2020-04-01 NOTE — BH Assessment (Signed)
Charge nurse notified of patient's need for a translator.

## 2020-04-01 NOTE — ED Notes (Signed)
Pt provided with a caffeine free coca cola.

## 2020-04-01 NOTE — Consult Note (Signed)
Reviewed Lab:    Spoke with Dr. Jacqulyn Bath [facility] EDP) related to [facility] positive COVID and negative Coronavirus 2 Ag.  States he will order a second Ag and if it comes back negative for the second time patient should be okay for psychiatric admission.

## 2020-04-01 NOTE — Consult Note (Signed)
  Reviewed Lab:    Spoke with Dr. Jacqulyn Bath [facility] EDP) related to [facility] positive COVID and negative Coronavirus 2 Ag.  States he will order a second Ag and if it comes back negative for the second time patient should be okay for psychiatric admission.        Coronavirus 2 Ag: is the rapid test and often give false negatives  The 2 hour testing is more accurate.    According to the guidelines if patient has tested positive for COVID on 03/28/20 he will be appropriate for inpatient psychiatric admission on 04/02/20 or after.

## 2020-04-01 NOTE — BH Assessment (Signed)
Clinician attempted to complete patient's TTS assessment. Prior to starting the TTS assessment he requested a Nurse, learning disability. Clinician attempting to notify nursing of patient's request.

## 2020-04-01 NOTE — BH Assessment (Signed)
Re-assessment 04/02/2019:  Kevin Brown is a 30yr old male with history of Schizophrenia.   Patient stating that he was hearing voices in his head and this is what brought him to the Ed. He says that it was a "bunch of voices trying to drive him crazy". The onset of the voices have been intermittent x3 months. The voices were telling him to be homosexual, dress as a woman, and take his own life. States, "I never accepted any of the things that the voices told me". Today, he continues to hear voices but is only hearing one voices vs multiple.  States that the voice today is a different voice that are continuing to tell him that he is homosexual. The voices are also calling him a warlock. States, "I'm really tired of the voices fucking with me". He denies acting out on the command hallucinations. He denies visual hallucinations. He has current tactile hallucinations stating, "I feel like something is being injecting in my body and it's causing me chills".    Patient denies current suicidal ideations. States, " I want to live life without fear, calm manner, and I want these voices to leave me alone". Denies a history of suicide attempts and/gestures. Denies self-mutilating behaviors. Denies depressive symptoms. He does have intermittent symptoms related to anxiety. Appetite is good. He sleeping routine has been poor due to the auditory hallucinations.

## 2020-04-02 NOTE — ED Provider Notes (Signed)
Emergency Medicine Observation Re-evaluation Note  Kevin Brown is a 30 y.o. male, seen on rounds today.  Pt initially presented to the ED for complaints of Psychiatric Evaluation and IVC Currently, the patient is awaiting placement, resting in bed  Physical Exam  BP 127/71   Pulse 84   Temp 98 F (36.7 C)   Resp 16   SpO2 99%  Physical Exam General: calm and cooperative Cardiac: warm and well perfused Lungs: even and unlabored Psych: calm  ED Course / MDM  EKG:EKG Interpretation  Date/Time:  Friday March 28 2020 11:10:36 EST Ventricular Rate:  97 PR Interval:    QRS Duration: 84 QT Interval:  339 QTC Calculation: 431 R Axis:   102 Text Interpretation: Sinus rhythm Borderline right axis deviation When compared with ECG of 12/24/2019, No significant change was found Confirmed by Kevin Brown ([id number]) on 03/29/2020 4:26:35 AM    I have reviewed the labs performed to date as well as medications administered while in observation.  Recent changes in the last 24 hours include covid antigen negative x 2. Awaiting placement.  Plan  Current plan is for psych placement, has been antigen x 2 negative for covid. Patient is under full IVC at this time.   Kevin Loll, MD 04/02/20 [phone]

## 2020-04-03 MED ORDER — ONDANSETRON 4 MG PO TBDP
4.0000 mg | ORAL_TABLET | Freq: Once | ORAL | Status: AC
  Administered 2020-04-03: 4 mg via ORAL
  Filled 2020-04-03: qty 1

## 2020-04-03 MED ORDER — ONDANSETRON 4 MG PO TBDP
4.0000 mg | ORAL_TABLET | Freq: Three times a day (TID) | ORAL | Status: DC | PRN
  Administered 2020-04-03: 4 mg via ORAL
  Filled 2020-04-03: qty 1

## 2020-04-03 NOTE — ED Notes (Signed)
Safety sitter at bedside 

## 2020-04-03 NOTE — BH Assessment (Addendum)
B[facility] Assessment Progress Note  Per Nelly Rout, MD, this pt continues to require psychiatric hospitalization at this time.  Pt remains under IVC initiated by EDP Kennis Carina, MD.  Pt has been cleared from Covid-19.  The following facilities have been contacted to seek placement for this pt, with results as noted:  Beds available, information sent, decision pending: [facility]  At capacity: [facility], [place] Behavioral Health Coordinator [phone]

## 2020-04-03 NOTE — Progress Notes (Signed)
   Kevin Kindred, MD, this pt continues to require psychiatric hospitalization at this time.  Referred to the following additional facilities:  [facility]  [facility]  [facility]  [facility]  [facility]  [facility]  [facility]  [facility]  [facility]  [facility]   CSW to follow up.  Ladoris Gene MSW,LCSWA,LCASA Clinical Social Worker  [facility] [phone] (cell)

## 2020-04-03 NOTE — ED Provider Notes (Signed)
Emergency Medicine Observation Re-evaluation Note  Jamarian Jacinto is a 30 y.o. male, seen on rounds today.  Pt initially presented to the ED for complaints of Psychiatric Evaluation and IVC Currently, the patient is full IVC awaiting psych placement..  Physical Exam  BP 123/65 (BP Location: Left Arm)   Pulse 84   Temp 98.5 F (36.9 C) (Oral)   Resp 17   SpO2 97%  Physical Exam General: No acute distress Cardiac: Regular rate and rhythm Lungs: Clear to auscultation Psych: Stable  ED Course / MDM  EKG:EKG Interpretation  Date/Time:  Friday March 28 2020 11:10:36 EST Ventricular Rate:  97 PR Interval:    QRS Duration: 84 QT Interval:  339 QTC Calculation: 431 R Axis:   102 Text Interpretation: Sinus rhythm Borderline right axis deviation When compared with ECG of 12/24/2019, No significant change was found Confirmed by Dione Booze ([id number]) on 03/29/2020 4:26:35 AM    I have reviewed the labs performed to date as well as medications administered while in observation.  Recent changes in the last 24 hours include none.  Plan  Current plan is for psychiatric placement Patient's full IVC. Patient is under full IVC at this time.   Vanetta Mulders, MD 04/03/20 1327

## 2020-04-03 NOTE — ED Notes (Signed)
Patient received breakfast tray 

## 2020-04-03 NOTE — ED Notes (Signed)
Pt resting in stretcher, breathing even and unlabored. Offers no complaints at this time. Has been calm and cooperative at this time, and has been throughout shift. Pt cont pending psych placement.

## 2020-04-04 DIAGNOSIS — F209 Schizophrenia, unspecified: Secondary | ICD-10-CM

## 2020-04-04 LAB — RESP PANEL BY RT-PCR (FLU A&B, COVID) ARPGX2
Influenza A by PCR: NEGATIVE
Influenza B by PCR: NEGATIVE
SARS Coronavirus 2 by RT PCR: NEGATIVE

## 2020-04-04 NOTE — Patient Outreach (Signed)
ED Peer Support Specialist Patient Intake (Complete at intake & 30-60 Day Follow-up)  Name: Kevin Brown  MRN: [id number]  Age: 30 y.o.   Date of Admission: 04/04/2020  Intake: Initial Comments:      Primary Reason Admitted: Psychiatric Evaluation, IVC   Lab values: Alcohol/ETOH: Positive Positive UDS? Yes Amphetamines: Yes Barbiturates: No Benzodiazepines: No Cocaine: No Opiates: No Cannabinoids: Yes  Demographic information: Gender: Male Ethnicity: Latino Marital Status: Single Insurance Status: Uninsured/Self-pay Control and instrumentation engineer (Work Engineer, agricultural, Sales executive, etc.: No Lives with: Alone Living situation: House/Apartment  Reported Patient History: Patient reported health conditions: None Patient aware of HIV and hepatitis status: No  In past year, has patient visited ED for any reason? Yes  Number of ED visits: 3  Reason(s) for visit: Various reasons  In past year, has patient been hospitalized for any reason? Yes  Number of hospitalizations:    Reason(s) for hospitalization:    In past year, has patient been arrested? No  Number of arrests:    Reason(s) for arrest:    In past year, has patient been incarcerated? No  Number of incarcerations:    Reason(s) for incarceration:    In past year, has patient received medication-assisted treatment? No  In past year, patient received the following treatments:    In past year, has patient received any harm reduction services?    Did this include any of the following?    In past year, has patient received care from a mental health provider for diagnosis other than SUD? No  In past year, is this first time patient has overdosed? No  Number of past overdoses:    In past year, is this first time patient has been hospitalized for an overdose? No  Number of hospitalizations for overdose(s):    Is patient currently receiving treatment for a mental health diagnosis? No  Patient  reports experiencing difficulty participating in SUD treatment: No    Most important reason(s) for this difficulty?    Has patient received prior services for treatment? No  In past, patient has received services from following agencies:    Plan of Care:  Suggested follow up at these agencies/treatment centers: Other (comment)  Other information: [facility] was able to gain information to better assist Pt. [facility] in [place] stated that Pt can come to facility. [facility] was able to ask Pt about identification. [facility] was made aware that he does not have any Identification. [facility] issued Pt resource list for services an to assist Pt with getting Identification.    Arlys John Baleria Wyman, CPSS  04/04/2020 1:28 PM

## 2020-04-04 NOTE — ED Notes (Signed)
Pt resting in bed at this time. NAD noted. Equal rise and fall of chest noted. Pt has spoken with peer support- Per peer support: seeking placement

## 2020-04-04 NOTE — ED Notes (Signed)
Pt given meal tray.

## 2020-04-04 NOTE — ED Provider Notes (Signed)
Patient cleared by behavioral health for discharge home.   Vanetta Mulders, MD 04/04/20 1351

## 2020-04-04 NOTE — Discharge Instructions (Signed)
For your behavioral health needs you are advised to follow up with [facility]:       [facility]      [address]      [place], [place] [address]      [phone]      Ask about their Substance Abuse Intensive Outpatient Program.  They also offer psychiatry/medication management and therapy.  New patients are being seen in their walk-in clinic.  Walk-in hours are Monday - Thursday from 8:00 am - 11:00 am for psychiatry, and Friday from 1:00 pm - 4:00 pm for therapy.  Walk-in patients are seen on a first come, first served basis, so try to arrive as early as possible for the best chance of being seen the same day.
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
## 2020-04-04 NOTE — ED Notes (Signed)
Pt temporarily placed in Rm 22 for TTS consult

## 2020-04-04 NOTE — BH Assessment (Signed)
[facility] Assessment Progress Note  Per Berneice Heinrich, NP, this pt does not require psychiatric hospitalization at this time.  Pt presented under IVC, but this expired this morning at 04:31.  Pt is psychiatrically cleared.  Discharge instructions include referral information for [facility].  Pt would also benefit from seeing Peer Support Specialists, and a peer support consult has been ordered for pt.  EDP Vanetta Mulders, MD and pt's nurse, [place], have been notified.  Doylene Canning, MA Triage Specialist [phone]

## 2020-04-04 NOTE — Progress Notes (Signed)
CSW received a VM from [facility] stating that they are unable to accept the patient at this time due to being at capacity   Disposition CSW will continue to assist in seeking appropriate bed placement for Pt.    Jacinta Shoe, LCSW Clinical Social Worker - Disposition  [phone]

## 2020-04-08 ENCOUNTER — Emergency Department (HOSPITAL_COMMUNITY)
Admission: EM | Admit: 2020-04-08 | Discharge: 2020-04-09 | Payer: Self-pay | Attending: Emergency Medicine | Admitting: Emergency Medicine

## 2020-04-08 ENCOUNTER — Encounter (HOSPITAL_COMMUNITY): Payer: Self-pay | Admitting: Emergency Medicine

## 2020-04-08 DIAGNOSIS — F15959 Other stimulant use, unspecified with stimulant-induced psychotic disorder, unspecified: Secondary | ICD-10-CM | POA: Insufficient documentation

## 2020-04-08 DIAGNOSIS — R441 Visual hallucinations: Secondary | ICD-10-CM | POA: Insufficient documentation

## 2020-04-08 DIAGNOSIS — Z20822 Contact with and (suspected) exposure to covid-19: Secondary | ICD-10-CM | POA: Insufficient documentation

## 2020-04-08 DIAGNOSIS — R443 Hallucinations, unspecified: Secondary | ICD-10-CM

## 2020-04-08 DIAGNOSIS — R1084 Generalized abdominal pain: Secondary | ICD-10-CM | POA: Insufficient documentation

## 2020-04-08 DIAGNOSIS — R440 Auditory hallucinations: Secondary | ICD-10-CM | POA: Insufficient documentation

## 2020-04-08 DIAGNOSIS — F17210 Nicotine dependence, cigarettes, uncomplicated: Secondary | ICD-10-CM | POA: Insufficient documentation

## 2020-04-08 DIAGNOSIS — R112 Nausea with vomiting, unspecified: Secondary | ICD-10-CM | POA: Insufficient documentation

## 2020-04-08 LAB — CBC
HCT: 49 % (ref 39.0–52.0)
Hemoglobin: 16.9 g/dL (ref 13.0–17.0)
MCH: 30.2 pg (ref 26.0–34.0)
MCHC: 34.5 g/dL (ref 30.0–36.0)
MCV: 87.5 fL (ref 80.0–100.0)
Platelets: 321 10*3/uL (ref 150–400)
RBC: 5.6 MIL/uL (ref 4.22–5.81)
RDW: 11.9 % (ref 11.5–15.5)
WBC: 7.2 10*3/uL (ref 4.0–10.5)
nRBC: 0 % (ref 0.0–0.2)

## 2020-04-08 LAB — URINALYSIS, ROUTINE W REFLEX MICROSCOPIC
Bacteria, UA: NONE SEEN
Bilirubin Urine: NEGATIVE
Glucose, UA: NEGATIVE mg/dL
Hgb urine dipstick: NEGATIVE
Ketones, ur: NEGATIVE mg/dL
Nitrite: NEGATIVE
Protein, ur: NEGATIVE mg/dL
Specific Gravity, Urine: 1.015 (ref 1.005–1.030)
pH: 5 (ref 5.0–8.0)

## 2020-04-08 LAB — COMPREHENSIVE METABOLIC PANEL
ALT: 32 U/L (ref 0–44)
AST: 34 U/L (ref 15–41)
Albumin: 4.5 g/dL (ref 3.5–5.0)
Alkaline Phosphatase: 112 U/L (ref 38–126)
Anion gap: 11 (ref 5–15)
BUN: 12 mg/dL (ref 6–20)
CO2: 25 mmol/L (ref 22–32)
Calcium: 9.2 mg/dL (ref 8.9–10.3)
Chloride: 103 mmol/L (ref 98–111)
Creatinine, Ser: 1.02 mg/dL (ref 0.61–1.24)
GFR, Estimated: >60 mL/min (ref >60)
Glucose, Bld: 102 mg/dL — ABNORMAL HIGH (ref 70–99)
Potassium: 4 mmol/L (ref 3.5–5.1)
Sodium: 139 mmol/L (ref 135–145)
Total Bilirubin: 0.8 mg/dL (ref 0.3–1.2)
Total Protein: 8.1 g/dL (ref 6.5–8.1)

## 2020-04-08 LAB — LIPASE, BLOOD: Lipase: 32 U/L (ref 11–51)

## 2020-04-08 LAB — ETHANOL: Alcohol, Ethyl (B): <10 mg/dL (ref <10)

## 2020-04-08 MED ORDER — SODIUM CHLORIDE 0.9 % IV BOLUS
1000.0000 mL | Freq: Once | INTRAVENOUS | Status: AC
  Administered 2020-04-08: 1000 mL via INTRAVENOUS

## 2020-04-08 MED ORDER — HALOPERIDOL LACTATE 5 MG/ML IJ SOLN
4.0000 mg | Freq: Once | INTRAMUSCULAR | Status: AC
  Administered 2020-04-08: 4 mg via INTRAVENOUS
  Filled 2020-04-08: qty 1

## 2020-04-08 NOTE — ED Triage Notes (Signed)
Patient reports that he thinks he has food posioning, states that he has abdominal pain, had two beers this morning. Abdominal pain since 1/17. States there is venom in his food from snakes and frogs. Endorses nausea. Interpretor used.
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
## 2020-04-08 NOTE — ED Triage Notes (Signed)
[facility] transported pt from [facility] and reports the following:  Pt reports abdominal pain since 1/17 at 6 pm, and he had 2 beers this morning.

## 2020-04-09 ENCOUNTER — Ambulatory Visit (HOSPITAL_COMMUNITY)
Admission: EM | Admit: 2020-04-09 | Discharge: 2020-04-09 | Disposition: A | Discharge status: Home / Self Care | Payer: No Payment, Other | Attending: Nurse Practitioner | Admitting: Nurse Practitioner

## 2020-04-09 ENCOUNTER — Encounter (HOSPITAL_COMMUNITY): Payer: Self-pay | Admitting: Emergency Medicine

## 2020-04-09 ENCOUNTER — Other Ambulatory Visit: Payer: Self-pay

## 2020-04-09 DIAGNOSIS — F1510 Other stimulant abuse, uncomplicated: Secondary | ICD-10-CM

## 2020-04-09 DIAGNOSIS — F15950 Other stimulant use, unspecified with stimulant-induced psychotic disorder with delusions: Secondary | ICD-10-CM | POA: Insufficient documentation

## 2020-04-09 DIAGNOSIS — F17210 Nicotine dependence, cigarettes, uncomplicated: Secondary | ICD-10-CM | POA: Insufficient documentation

## 2020-04-09 DIAGNOSIS — F19959 Other psychoactive substance use, unspecified with psychoactive substance-induced psychotic disorder, unspecified: Secondary | ICD-10-CM | POA: Insufficient documentation

## 2020-04-09 DIAGNOSIS — F1590 Other stimulant use, unspecified, uncomplicated: Secondary | ICD-10-CM | POA: Insufficient documentation

## 2020-04-09 LAB — RESP PANEL BY RT-PCR (FLU A&B, COVID) ARPGX2
Influenza A by PCR: NEGATIVE
Influenza B by PCR: NEGATIVE
SARS Coronavirus 2 by RT PCR: NEGATIVE

## 2020-04-09 MED ORDER — PANTOPRAZOLE SODIUM 40 MG PO TBEC: 40.0000 mg | DELAYED_RELEASE_TABLET | Freq: Every day | ORAL | 0 refills | Status: AC

## 2020-04-09 MED ORDER — HYDROXYZINE HCL 25 MG PO TABS: 25.0000 mg | ORAL_TABLET | Freq: Three times a day (TID) | ORAL | 0 refills | Status: AC | PRN

## 2020-04-09 MED ORDER — TRAZODONE HCL 50 MG PO TABS
50.0000 mg | ORAL_TABLET | Freq: Every evening | ORAL | Status: DC | PRN
  Filled 2020-04-09: qty 7

## 2020-04-09 MED ORDER — OLANZAPINE 10 MG PO TABS
10.0000 mg | ORAL_TABLET | Freq: Two times a day (BID) | ORAL | Status: DC
  Filled 2020-04-09: qty 1
  Filled 2020-04-09: qty 14

## 2020-04-09 MED ORDER — OLANZAPINE 10 MG PO TABS
10.0000 mg | ORAL_TABLET | Freq: Two times a day (BID) | ORAL | 0 refills | Status: AC
Start: 2020-04-09 — End: ?

## 2020-04-09 MED ORDER — ALUM & MAG HYDROXIDE-SIMETH 200-200-20 MG/5ML PO SUSP: 30.0000 mL | ORAL | Status: DC | PRN

## 2020-04-09 MED ORDER — TRAZODONE HCL 50 MG PO TABS: 50.0000 mg | ORAL_TABLET | Freq: Every evening | ORAL | 0 refills | Status: AC | PRN

## 2020-04-09 MED ORDER — HYDROXYZINE HCL 25 MG PO TABS
25.0000 mg | ORAL_TABLET | Freq: Three times a day (TID) | ORAL | Status: DC | PRN
  Filled 2020-04-09: qty 10

## 2020-04-09 MED ORDER — MAGNESIUM HYDROXIDE 400 MG/5ML PO SUSP: 30.0000 mL | Freq: Every day | ORAL | Status: DC | PRN

## 2020-04-09 MED ORDER — PANTOPRAZOLE SODIUM 40 MG PO TBEC
40.0000 mg | DELAYED_RELEASE_TABLET | Freq: Every day | ORAL | Status: DC
  Filled 2020-04-09: qty 7

## 2020-04-09 MED ORDER — ACETAMINOPHEN 325 MG PO TABS: 650.0000 mg | ORAL_TABLET | Freq: Four times a day (QID) | ORAL | Status: DC | PRN

## 2020-04-09 NOTE — Discharge Summary (Signed)
Kevin Brown to be D/C'd Home per NP order.  Discussed with the patient and all questions fully answered via Engineer, structural.   An After Visit Summary was printed and given to the patient. Patient received prescription and sample of medications with out patient referrals, and homeless shelter resources/food pantry resources.   D/c education completed with patient/ patient able to verbalize understanding, all questions fully answered.   Patient instructed to return to ED, call 911, or call NP for any changes in condition.  Patient received bus ticket as well.  Leamon Arnt 04/09/2020 11:25 AM

## 2020-04-09 NOTE — ED Notes (Signed)
PATIENT BELONGINGS ARE PLACED IN CLOCKER#[id number]

## 2020-04-09 NOTE — ED Notes (Signed)
Safe transport arrived for patient.

## 2020-04-09 NOTE — ED Notes (Signed)
TTS assessment in began. Video interpreter present in room.

## 2020-04-09 NOTE — BH Assessment (Signed)
Clinician attempted to make contact with Triage but there was no answer; clinician called the main hospital number and spoke to Lauren who stated that the people in Triage were all currently attempting to manage a situation with a psych pt that just came in. Clinician stated she would attempt assessment at a later time.

## 2020-04-09 NOTE — ED Triage Notes (Signed)
As per interpreter, pt presents with Delusional thoughts, of witches, venom in his food.  Denies SI, HI.

## 2020-04-09 NOTE — ED Notes (Signed)
Information obtained via Spanish interpreter, skin search completed,  Meal given  Pt awake, alert & responsive, no distress noted, Monitoring for safety.

## 2020-04-09 NOTE — ED Provider Notes (Signed)
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 Average Risk  23.4   11.0        Use the calculated Patient Ratio above and the CHD Risk Table to determine the patient's CHD Risk.        ATP III CLASSIFICATION (LDL):  <100     mg/dL   Optimal  130-865  mg/dL   Near or Above                    Optimal  130-159  mg/dL   Borderline  784-696  mg/dL   High  >295     mg/dL   Very High Performed at [facility], [address], [place], [place] [address]   . TSH 12/24/2019 3.325  0.350 - 4.500 uIU/mL Final   Comment: Performed by a 3rd Generation assay with a functional sensitivity of <=0.01 uIU/mL. Performed at [facility], [address], [place], [place] [address]   Admission on 12/20/2019, Discharged on 12/22/2019  Component Date Value Ref Range Status  . Sodium 12/20/2019 140  135 - 145 mmol/L Final  . Potassium 12/20/2019 3.9  3.5 - 5.1 mmol/L Final  . Chloride 12/20/2019 104  98 - 111 mmol/L Final  . CO2 12/20/2019 26  22 - 32 mmol/L Final  . Glucose, Bld 12/20/2019 122* 70 - 99 mg/dL Final   Glucose reference range applies only to samples taken after fasting for at least 8 hours.  . BUN 12/20/2019 8  6 - 20 mg/dL Final  . Creatinine, Ser 12/20/2019 0.74  0.61 - 1.24 mg/dL Final  . Calcium 16/12/9602 9.3  8.9 - 10.3 mg/dL Final  . Total Protein 12/20/2019 6.8  6.5 - 8.1 g/dL Final  . Albumin 54/11/8117 3.9  3.5 - 5.0 g/dL Final  . AST 14/78/2956 18  15 - 41 U/L Final  . ALT 12/20/2019 14  0 - 44 U/L Final  . Alkaline Phosphatase 12/20/2019 99  38 - 126 U/L Final  . Total Bilirubin 12/20/2019 0.5  0.3 - 1.2 mg/dL Final  . GFR calc non Af Amer 12/20/2019 >60  >60 mL/min Final  . GFR calc Af Amer 12/20/2019 >60  >60 mL/min Final  . Anion gap  12/20/2019 10  5 - 15 Final   Performed at [facility], [address], [place], [place] [address]  . Alcohol, Ethyl (B) 12/20/2019 <10  <10 mg/dL Final   Comment: (NOTE) Lowest detectable limit for serum alcohol is 10 mg/dL.  For medical purposes only. Performed at [facility], [address] [place], [place] [address]   . WBC 12/20/2019 4.3  4.0 - 10.5 K/uL Final  . RBC 12/20/2019 4.88  4.22 - 5.81 MIL/uL Final  . Hemoglobin 12/20/2019 15.0  13.0 - 17.0 g/dL Final  . HCT 69/62/9528 43.8  39.0 - 52.0 % Final  . MCV 12/20/2019 89.8  80.0 - 100.0 fL Final  . MCH 12/20/2019 30.7  26.0 - 34.0 pg Final  . MCHC 12/20/2019 34.2  30.0 - 36.0 g/dL Final  . RDW 41/32/4401 12.6  11.5 - 15.5 % Final  . Platelets 12/20/2019 242  150 - 400 K/uL Final  . nRBC 12/20/2019 0.0  0.0 - 0.2 % Final  . Neutrophils Relative % 12/20/2019 64  % Final  . Neutro Abs 12/20/2019 2.8  1.7 - 7.7 K/uL Final  . Lymphocytes Relative 12/20/2019 23  % Final  . Lymphs Abs 12/20/2019 1.0  0.7 - 4.0 K/uL Final  . Monocytes Relative 12/20/2019 8  % Final  . Monocytes Absolute 12/20/2019 0.3  0.1 - 1.0 K/uL Final  . Eosinophils Relative 12/20/2019 4  % Final  . Eosinophils Absolute 12/20/2019 0.2  0.0 - 0.5 K/uL Final  . Basophils Relative 12/20/2019 1  % Final  . Basophils Absolute 12/20/2019 0.1  0.0 - 0.1 K/uL Final  . Immature Granulocytes 12/20/2019 0  % Final  . Abs Immature Granulocytes 12/20/2019 0.01  0.00 - 0.07 K/uL Final   Performed at [facility], [address] [place], [place] [address]  . Opiates 12/21/2019 NONE DETECTED  NONE DETECTED Final  . Cocaine 12/21/2019 NONE DETECTED  NONE DETECTED Final  . Benzodiazepines 12/21/2019 NONE DETECTED  NONE DETECTED Final  . Amphetamines 12/21/2019 POSITIVE* NONE DETECTED Final  . Tetrahydrocannabinol 12/21/2019 NONE DETECTED  NONE DETECTED Final  . Barbiturates 12/21/2019 NONE DETECTED  NONE DETECTED  Final   Comment: (NOTE) DRUG SCREEN FOR MEDICAL PURPOSES ONLY.  IF CONFIRMATION IS NEEDED FOR ANY PURPOSE, NOTIFY LAB WITHIN 5 DAYS.  LOWEST DETECTABLE LIMITS FOR URINE DRUG SCREEN Drug Class                     Cutoff (ng/mL) Amphetamine and  metabolites    1000 Barbiturate and metabolites    200 Benzodiazepine                 200 Tricyclics and metabolites     300 Opiates and metabolites        300 Cocaine and metabolites        300 THC                            50 Performed at [facility], [address] [place], [place] [address]   . Lipase 12/20/2019 61* 11 - 51 U/L Final   Performed at [facility], [address] [place], [place] [address]  . Acetaminophen (Tylenol), Serum 12/20/2019 <10* 10 - 30 ug/mL Final   Comment: (NOTE) Therapeutic concentrations vary significantly. A range of 10-30 ug/mL  may be an effective concentration for many patients. However, some  are best treated at concentrations outside of this range. Acetaminophen concentrations >150 ug/mL at 4 hours after ingestion  and >50 ug/mL at 12 hours after ingestion are often associated with  toxic reactions.  Performed at [facility], [address] [place], [place] [address]   . Salicylate Lvl 12/20/2019 <7.0* 7.0 - 30.0 mg/dL Final   Performed at [facility], [address] [place], [place] [address]  . SARS Coronavirus 2 by RT PCR 12/20/2019 NEGATIVE  NEGATIVE Final   Comment: (NOTE) SARS-CoV-2 target nucleic acids are NOT DETECTED.  The SARS-CoV-2 RNA is generally detectable in upper respiratoy specimens during the acute phase of infection. The lowest concentration of SARS-CoV-2 viral copies this assay can detect is 131 copies/mL. A negative result does not preclude SARS-Cov-2 infection and should not be used as the sole basis for treatment or other patient management decisions. A negative result may occur with   improper specimen collection/handling, submission of specimen other than nasopharyngeal swab, presence of viral mutation(s) within the areas targeted by this assay, and inadequate number of viral copies (<131 copies/mL). A negative result must be combined with clinical observations, patient history, and epidemiological information. The expected result is Negative.  Fact Sheet for Patients:  https://www.moore.com/https://www.fda.gov/media/142436/download  Fact Sheet for Healthcare Providers:  https://www.young.biz/https://www.fda.gov/media/142435/download  This test is no                          t yet approved or cleared by the [place] FDA and  has been authorized for detection and/or diagnosis of SARS-CoV-2 by FDA under an Emergency Use Authorization (EUA). This EUA will remain  in effect (meaning this test can be used) for the duration of the COVID-19 declaration under Section 564(b)(1) of the Act, 21 U.S.C. section 360bbb-3(b)(1), unless the authorization is terminated or revoked sooner.    . Influenza A by PCR 12/20/2019 NEGATIVE  NEGATIVE Final  . Influenza B by PCR 12/20/2019 NEGATIVE  NEGATIVE Final   Comment: (NOTE) The Xpert Xpress SARS-CoV-2/FLU/RSV assay is intended as an aid in  the diagnosis of influenza from Nasopharyngeal swab specimens and  should not be used as a sole basis for treatment. Nasal washings and  aspirates are unacceptable for Xpert Xpress SARS-CoV-2/FLU/RSV  testing.  Fact Sheet for Patients: https://www.moore.com/https://www.fda.gov/media/142436/download  Fact Sheet for Healthcare Providers: https://www.young.biz/https://www.fda.gov/media/142435/download  This test is not yet approved or cleared by the [place] FDA and  has been authorized for detection and/or diagnosis of SARS-CoV-2 by  FDA under an Emergency Use Authorization (EUA). This EUA will remain  in effect (meaning this test can be used) for the duration of the  Covid-19 declaration under Section 564(b)(1) of the Act, 21  U.S.C. section 360bbb-3(b)(1),  unless the authorization is  terminated or revoked. Performed at [facility], [address], [place], [place] [address]     Allergies: Patient has no known allergies.  PTA Medications: (Not in a hospital admission)   Medical Decision Making  Patient was medically cleared in ED UDS pending  Continue olanzapine 10 mg BID for psychosis      Recommendations  Based on my evaluation the patient does not appear to have an emergency medical condition.  Jackelyn Poling, NP 04/09/20  6:24 AM

## 2020-04-09 NOTE — Discharge Instructions (Addendum)

## 2020-04-09 NOTE — ED Notes (Signed)
Safe transport called to take patient to [facility]

## 2020-04-09 NOTE — ED Provider Notes (Signed)
FBC/OBS ASAP Discharge Summary  Date and Time: 04/09/2020 10:51 AM  Name: Kevin Brown  MRN:  [id number]   Discharge Diagnoses:  Final diagnoses:  Substance-induced psychotic disorder (HCC)  Methamphetamine use (HCC)    Subjective: Patient reports today that he has felt a little bit better.  He states that he is not currently having hallucinations and he denies having any suicidal or homicidal ideations.  Patient does admit to using methamphetamines yesterday morning.  He states that he does not think that the methamphetamine use is causing him to have hallucinations.  Patient states that he does want to continue his medications and feels that he is safe to discharge if he has a place to go because he is homeless.  Patient is requesting resources for shelters.  Patient was informed of the [facility].  He was informed that we cannot guarantee a bed there and the patient is notified of the restrictions for the facility and he states understanding and agreement and is still requesting transportation there.  Stay Summary: Patient is a 30 year old male who presented to the [facility] with abdominal pain making comments about witches abusing him and bizarre comments.  Patient has a known history of methamphetamine use as well as inpatient hospitalization with a diagnosis of schizophrenia.  Patient was transferred to the [facility] see for treatment and stabilization.  Patient was restarted on his home medications of Zyprexa 10 mg p.o. twice daily and trazodone 2 mg p.o. nightly as needed.  This morning the patient was evaluated using electronic interpreter device.  Patient reports that his biggest concern is having a place to stay as he is homeless.  He states that he would like a prescription for his medications.  At first patient stated that he was in agreement with resources only but then stated that he needed a place to stay.  Patient was informed of the [facility] and he stated understanding and agreement.  Patient was provided with 7-day samples, 30-day prescriptions of his medications, and transportation via safe transport to the [facility].  Patient denied having any suicidal or homicidal ideations and denied any hallucinations.  Total Time spent with patient: 30 minutes  Past Psychiatric History: Schizophrenia, amphetamine use disorder, marijuana use disorder, substance-induced psychotic disorder, psychoactive substance induced psychosis Past Medical History: History reviewed. No pertinent past medical history. History reviewed. No pertinent surgical history. Family History: History reviewed. No pertinent family history. Family Psychiatric History: None reported Social History:  Social History   Substance and Sexual Activity  Alcohol Use Yes     Social History   Substance and Sexual Activity  Drug Use Not Currently  . Types: Methamphetamines, Marijuana    Social History   Socioeconomic History  . Marital status: Single    Spouse name: Not on file  . Number of children: Not on file  . Years of education: Not on file  . Highest education level: Not on file  Occupational History  . Not on file  Tobacco Use  . Smoking status: Current Some Day Smoker    Types: Cigarettes  . Smokeless tobacco: Never Used  Vaping Use  . Vaping Use: Never used  Substance and Sexual Activity  . Alcohol use: Yes  . Drug use: Not Currently    Types: Methamphetamines, Marijuana  . Sexual activity: Not on file  Other Topics Concern  . Not on file  Social History Narrative  . Not on file   Social  Determinants of Health   Financial Resource Strain: Not on file  Food Insecurity: Not on file  Transportation Needs: Not on file  Physical Activity: Not on file  Stress: Not on file  Social Connections: Not on file   SDOH:  SDOH Screenings   Alcohol Screen: Medium Risk  . Last Alcohol Screening Score (AUDIT): 23  Depression  (PHQ2-9): Medium Risk  . PHQ-2 Score: 12  Financial Resource Strain: Not on file  Food Insecurity: Not on file  Housing: Not on file  Physical Activity: Not on file  Social Connections: Not on file  Stress: Not on file  Tobacco Use: High Risk  . Smoking Tobacco Use: Current Some Day Smoker  . Smokeless Tobacco Use: Never Used  Transportation Needs: Not on file    Has this patient used any form of tobacco in the last 30 days? (Cigarettes, Smokeless Tobacco, Cigars, and/or Pipes) A prescription for an FDA-approved tobacco cessation medication was offered at discharge and the patient refused  Current Medications:  Current Facility-Administered Medications  Medication Dose Route Frequency Provider Last Rate Last Admin  . acetaminophen (TYLENOL) tablet 650 mg  650 mg Oral Q6H PRN Jackelyn Poling, NP      . alum & mag hydroxide-simeth (MAALOX/MYLANTA) 200-200-20 MG/5ML suspension 30 mL  30 mL Oral Q4H PRN Nira Conn A, NP      . hydrOXYzine (ATARAX/VISTARIL) tablet 25 mg  25 mg Oral TID PRN Nira Conn A, NP      . magnesium hydroxide (MILK OF MAGNESIA) suspension 30 mL  30 mL Oral Daily PRN Jackelyn Poling, NP      . OLANZapine (ZYPREXA) tablet 10 mg  10 mg Oral BID Nira Conn A, NP      . traZODone (DESYREL) tablet 50 mg  50 mg Oral QHS PRN Jackelyn Poling, NP       Current Outpatient Medications  Medication Sig Dispense Refill  . paliperidone (INVEGA SUSTENNA) 234 MG/1.5ML SUSY injection Inject 234 mg into the muscle every 28 (twenty-eight) days. (Patient not taking: Reported on 04/03/2020) 1.8 mL 0  . paliperidone (INVEGA SUSTENNA) 234 MG/1.5ML SUSY injection Inject 234 mg into the muscle every 28 (twenty-eight) days. (Due on 01-24-20): For mood control (Patient not taking: Reported on 04/03/2020) 1.8 mL 0  . pantoprazole (PROTONIX) 40 MG tablet Take 1 tablet (40 mg total) by mouth daily. (Patient not taking: Reported on 04/03/2020) 30 tablet 0  . pregabalin (LYRICA) 150 MG capsule Take 1  capsule (150 mg total) by mouth at bedtime. (Patient not taking: Reported on 04/03/2020) 30 capsule 0  . risperiDONE (RISPERDAL) 4 MG tablet Take 1 tablet (4 mg total) by mouth at bedtime. (Patient not taking: Reported on 04/03/2020) 30 tablet 0    PTA Medications: (Not in a hospital admission)   Musculoskeletal  Strength & Muscle Tone: within normal limits Gait & Station: normal Patient leans: N/A  Psychiatric Specialty Exam  Presentation  General Appearance: Appropriate for Environment; Disheveled  Eye Contact:Fair  Speech:Clear and Coherent; Normal Rate  Speech Volume:Normal  Handedness:Right   Mood and Affect  Mood:Euthymic  Affect:Appropriate; Congruent   Thought Process  Thought Processes:Coherent  Descriptions of Associations:Intact  Orientation:Full (Time, Place and Person)  Thought Content:WDL  Hallucinations:Hallucinations: None  Ideas of Reference:None  Suicidal Thoughts:Suicidal Thoughts: No  Homicidal Thoughts:Homicidal Thoughts: No   Sensorium  Memory:Immediate Good; Recent Good; Remote Good  Judgment:Fair  Insight:Fair   Executive Functions  Concentration:Good  Attention Span:Good  Recall:Good  Progress Energy  of Knowledge:Good  Language:Good   Psychomotor Activity  Psychomotor Activity:Psychomotor Activity: Normal   Assets  Assets:Communication Skills; Desire for Improvement   Sleep  Sleep:Sleep: Fair   Physical Exam  Physical Exam Vitals and nursing note reviewed.  Constitutional:      Appearance: He is well-developed.  HENT:     Head: Normocephalic.  Eyes:     Pupils: Pupils are equal, round, and reactive to light.  Cardiovascular:     Rate and Rhythm: Normal rate.  Pulmonary:     Effort: Pulmonary effort is normal.  Musculoskeletal:        General: Normal range of motion.  Neurological:     Mental Status: He is alert and oriented to person, place, and time.    Review of Systems  Constitutional: Negative.    HENT: Negative.   Eyes: Negative.   Respiratory: Negative.   Cardiovascular: Negative.   Gastrointestinal: Negative.   Genitourinary: Negative.   Musculoskeletal: Negative.   Skin: Negative.   Neurological: Negative.   Endo/Heme/Allergies: Negative.   Psychiatric/Behavioral: Positive for substance abuse.   Blood pressure (!) 113/102, pulse 92, temperature 98 F (36.7 C), temperature source Oral, resp. rate 18, height 5\' 6"  (1.676 m), weight 70.3 kg, SpO2 99 %. Body mass index is 25.02 kg/m.  Demographic Factors:  Male, Low socioeconomic status and Unemployed  Loss Factors: NA  Historical Factors: NA  Risk Reduction Factors:   Living with another person, especially a relative and Positive social support  Continued Clinical Symptoms:  Alcohol/Substance Abuse/Dependencies  Cognitive Features That Contribute To Risk:  None    Suicide Risk:  Minimal: No identifiable suicidal ideation.  Patients presenting with no risk factors but with morbid ruminations; may be classified as minimal risk based on the severity of the depressive symptoms  Plan Of Care/Follow-up recommendations:  Continue activity as tolerated. Continue diet as recommended by your PCP. Ensure to keep all appointments with outpatient providers.  Disposition: Discharge to [facility]  [facility], Maryfrances Bunnell 04/09/2020, 10:51 AM
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
## 2020-04-16 ENCOUNTER — Ambulatory Visit (HOSPITAL_COMMUNITY): Payer: No Payment, Other | Admitting: Behavioral Health

## 2020-05-07 ENCOUNTER — Ambulatory Visit (HOSPITAL_COMMUNITY): Payer: No Payment, Other | Admitting: Behavioral Health

## 2022-06-15 ENCOUNTER — Other Ambulatory Visit: Payer: Self-pay

## 2022-06-15 ENCOUNTER — Emergency Department (HOSPITAL_COMMUNITY): Payer: Self-pay

## 2022-06-15 ENCOUNTER — Emergency Department (HOSPITAL_COMMUNITY)
Admission: EM | Admit: 2022-06-15 | Discharge: 2022-06-15 | Disposition: A | Discharge status: Home / Self Care | Payer: Self-pay | Attending: Emergency Medicine | Admitting: Emergency Medicine

## 2022-06-15 DIAGNOSIS — S60412A Abrasion of right middle finger, initial encounter: Secondary | ICD-10-CM | POA: Insufficient documentation

## 2022-06-15 DIAGNOSIS — S40012A Contusion of left shoulder, initial encounter: Secondary | ICD-10-CM | POA: Insufficient documentation

## 2022-06-15 DIAGNOSIS — W1830XA Fall on same level, unspecified, initial encounter: Secondary | ICD-10-CM | POA: Insufficient documentation

## 2022-06-15 MED ORDER — IBUPROFEN 200 MG PO TABS
400.0000 mg | ORAL_TABLET | Freq: Once | ORAL | Status: AC
  Administered 2022-06-15: 400 mg via ORAL
  Filled 2022-06-15: qty 2

## 2022-06-15 NOTE — ED Triage Notes (Signed)
BIBA with c/o mechanical fall, left shoulder pain  No deformity noted  Denies hitting head.  Denies blood thinner usage.  ETOH usage

## 2022-06-15 NOTE — Discharge Instructions (Signed)
The x-ray is normal and nothing is broken today.  Use ibuprofen as needed for pain

## 2022-06-15 NOTE — ED Provider Notes (Signed)
[facility] Provider Note   CSN: [id number] Arrival date & time: 06/15/22  1017     History  Chief Complaint  Patient presents with   Shoulder Pain    Kevin Brown is a 32 y.o. male.  Patient is a 32 year old male with a history of mental illness and substance use who presents today with complaint of left shoulder pain.  Patient reports that he was coming out of a trailer he lost his balance falling approximately 2 feet onto the ground.  His right arm stopped his fall but his left arm was behind him and he landed on his shoulder.  He is having pain of the left shoulder.  He denies any head injury, loss of consciousness, neck or back pain.  He has not had any pain in his legs and has been able to ambulate fine.  He denies any numbness or weakness in the left hand.  The history is provided by the patient. The history is limited by a language barrier. A language interpreter was used ([place]).  Shoulder Pain      Home Medications Prior to Admission medications   Medication Sig Start Date End Date Taking? Authorizing Provider  hydrOXYzine (ATARAX/VISTARIL) 25 MG tablet Take 1 tablet (25 mg total) by mouth 3 (three) times daily as needed for anxiety. 04/09/20   Money, Lowry Ram, FNP  OLANZapine (ZYPREXA) 10 MG tablet Take 1 tablet (10 mg total) by mouth 2 (two) times daily. 04/09/20   Money, Lowry Ram, FNP  pantoprazole (PROTONIX) 40 MG tablet Take 1 tablet (40 mg total) by mouth daily. 04/09/20   Money, Lowry Ram, FNP  traZODone (DESYREL) 50 MG tablet Take 1 tablet (50 mg total) by mouth at bedtime as needed for sleep. 04/09/20   Money, Lowry Ram, FNP      Allergies    Patient has no known allergies.    Review of Systems   Review of Systems  Physical Exam Updated Vital Signs BP (!) 140/71 (BP Location: Left Arm)   Pulse 78   Temp 97.7 F (36.5 C) (Oral)   Resp 18   SpO2 100%  Physical Exam Vitals and nursing note reviewed.   Constitutional:      General: He is not in acute distress.    Appearance: He is well-developed.  HENT:     Head: Normocephalic and atraumatic.  Eyes:     Conjunctiva/sclera: Conjunctivae normal.     Pupils: Pupils are equal, round, and reactive to light.  Cardiovascular:     Rate and Rhythm: Normal rate and regular rhythm.     Heart sounds: No murmur heard. Pulmonary:     Effort: Pulmonary effort is normal. No respiratory distress.     Breath sounds: Normal breath sounds. No wheezing or rales.  Abdominal:     General: There is no distension.     Palpations: Abdomen is soft.     Tenderness: There is no abdominal tenderness. There is no guarding or rebound.  Musculoskeletal:        General: Tenderness present. Normal range of motion.     Left shoulder: Tenderness and bony tenderness present. No deformity. Normal range of motion. Normal strength. Normal pulse.     Cervical back: Normal range of motion and neck supple.     Comments: Abrasion to the right third finger  Skin:    General: Skin is warm and dry.     Findings: No erythema or rash.  Neurological:     Mental Status: He is alert and oriented to person, place, and time. Mental status is at baseline.  Psychiatric:        Behavior: Behavior normal.     ED Results / Procedures / Treatments   Labs (all labs ordered are listed, but only abnormal results are displayed) Labs Reviewed - No data to display  EKG None  Radiology DG Shoulder Left  Result Date: 06/15/2022 CLINICAL DATA:  Trauma, fall, pain EXAM: LEFT SHOULDER - 2+ VIEW COMPARISON:  None Available. FINDINGS: No fracture or dislocation is seen. Possible small bony spur is seen in the inferior medial aspect of head of the proximal humerus. IMPRESSION: No recent fracture or dislocation is seen in left shoulder. Electronically Signed   By: Elmer Picker M.D.   On: 06/15/2022 11:36    Procedures Procedures    Medications Ordered in ED Medications   ibuprofen (ADVIL) tablet 400 mg (400 mg Oral Given 06/15/22 1044)    ED Course/ Medical Decision Making/ A&P                             Medical Decision Making Amount and/or Complexity of Data Reviewed Radiology: ordered and independent interpretation performed. Decision-making details documented in ED Course.  Risk OTC drugs.  Patient presenting today after a fall.  He is complaining of left shoulder pain.  No other significant findings on exam.  He denies any head injury or loss of consciousness.  He is awake and appropriate in the room.  Mentating normally.  Having pain in the posterior shoulder but low suspicion for dislocation as patient has good range of motion.  Plain films pending.  I have independently visualized and interpreted pt's images today.  Shoulder x-ray is within normal limits.  Supportive care and patient stable for discharge         Final Clinical Impression(s) / ED Diagnoses Final diagnoses:  Contusion of left shoulder, initial encounter    Rx / DC Orders ED Discharge Orders     None         Blanchie Dessert, MD 06/15/22 1243

## 2024-02-25 DIAGNOSIS — F10920 Alcohol use, unspecified with intoxication, uncomplicated: Secondary | ICD-10-CM

## 2024-02-25 DIAGNOSIS — F10120 Alcohol abuse with intoxication, uncomplicated: Secondary | ICD-10-CM | POA: Insufficient documentation

## 2024-02-25 DIAGNOSIS — Y908 Blood alcohol level of 240 mg/100 ml or more: Secondary | ICD-10-CM | POA: Insufficient documentation

## 2024-02-25 MED ADMIN — Lactated Ringer's Solution: 1000 mL | INTRAVENOUS | NDC 00338011704

## 2024-02-25 MED ADMIN — Thiamine HCl Inj 100 MG/ML: 100 mg | INTRAVENOUS | NDC 63323001301
